# Patient Record
Sex: Male | Born: 1985 | Hispanic: Yes | Marital: Single | Smoking: Never smoker
Health system: Southern US, Community
[De-identification: ages and names within clinical notes are randomized; demographics above are authoritative.]

## PROBLEM LIST (undated history)

## (undated) DIAGNOSIS — J45909 Unspecified asthma, uncomplicated: Secondary | ICD-10-CM

---

## 2015-03-09 ENCOUNTER — Emergency Department (HOSPITAL_COMMUNITY): Payer: Self-pay

## 2015-03-09 ENCOUNTER — Emergency Department (HOSPITAL_COMMUNITY)
Admission: EM | Admit: 2015-03-09 | Discharge: 2015-03-10 | Disposition: A | Discharge status: Home / Self Care | Payer: Self-pay | Attending: Emergency Medicine | Admitting: Emergency Medicine

## 2015-03-09 ENCOUNTER — Encounter (HOSPITAL_COMMUNITY): Payer: Self-pay | Admitting: *Deleted

## 2015-03-09 DIAGNOSIS — H81399 Other peripheral vertigo, unspecified ear: Secondary | ICD-10-CM | POA: Insufficient documentation

## 2015-03-09 DIAGNOSIS — R0602 Shortness of breath: Secondary | ICD-10-CM | POA: Insufficient documentation

## 2015-03-09 DIAGNOSIS — R0789 Other chest pain: Secondary | ICD-10-CM | POA: Insufficient documentation

## 2015-03-09 DIAGNOSIS — H539 Unspecified visual disturbance: Secondary | ICD-10-CM | POA: Insufficient documentation

## 2015-03-09 DIAGNOSIS — H6123 Impacted cerumen, bilateral: Secondary | ICD-10-CM | POA: Insufficient documentation

## 2015-03-09 DIAGNOSIS — R509 Fever, unspecified: Secondary | ICD-10-CM | POA: Insufficient documentation

## 2015-03-09 LAB — BASIC METABOLIC PANEL
ANION GAP: 11 (ref 5–15)
BUN: 14 mg/dL (ref 6–20)
CHLORIDE: 107 mmol/L (ref 101–111)
CO2: 22 mmol/L (ref 22–32)
CREATININE: 0.93 mg/dL (ref 0.61–1.24)
Calcium: 9.3 mg/dL (ref 8.9–10.3)
GFR calc non Af Amer: >60 mL/min (ref >60)
GLUCOSE: 118 mg/dL — AB (ref 65–99)
Potassium: 3.8 mmol/L (ref 3.5–5.1)
Sodium: 140 mmol/L (ref 135–145)

## 2015-03-09 LAB — CBC
HCT: 45.6 % (ref 39.0–52.0)
HEMOGLOBIN: 15.9 g/dL (ref 13.0–17.0)
MCH: 30.3 pg (ref 26.0–34.0)
MCHC: 34.9 g/dL (ref 30.0–36.0)
MCV: 86.9 fL (ref 78.0–100.0)
Platelets: 257 10*3/uL (ref 150–400)
RBC: 5.25 MIL/uL (ref 4.22–5.81)
RDW: 13.3 % (ref 11.5–15.5)
WBC: 13.1 10*3/uL — ABNORMAL HIGH (ref 4.0–10.5)

## 2015-03-09 LAB — I-STAT TROPONIN, ED: Troponin i, poc: 0 ng/mL (ref 0.00–0.08)

## 2015-03-09 MED ORDER — MECLIZINE HCL 25 MG PO TABS
25.0000 mg | ORAL_TABLET | Freq: Once | ORAL | Status: AC
  Administered 2015-03-10: 25 mg via ORAL
  Filled 2015-03-09: qty 1

## 2015-03-09 NOTE — ED Notes (Signed)
Pt c/o left sided chest pain radiating to left arm since yesterday. Pt denies any shortness of breath, n/v. Pt denies any recent drug use

## 2015-03-09 NOTE — ED Notes (Signed)
Dr. Yelverton at the bedside.  

## 2015-03-09 NOTE — ED Provider Notes (Signed)
CSN: [id number]     Arrival date & time 03/09/15  2054 History   This chart was scribed for Loren Racer, MD by Evon Slack, ED Scribe. This patient was seen in room D30C/D30C and the patient's care was started at 11:06 PM.      Chief Complaint  Patient presents with  . Chest Pain   The history is provided by the patient. No language interpreter was used.   HPI Comments: Roberto Lowe is a 29 y.o. male who presents to the Emergency Department complaining of intermittent CP onset 2 days prior. Pt states that the pain is radiating to his left arm. Pt states that he is intermittently SOB and dizzy as well. Pt states that the pain is worse when taking a deep breath. He doesn't report any alleviating factors. Pt denies any recent long distance travel. Pt denies cough, fever, tinnitus, rhinorrhea, congestion, nausea or vomiting.   History reviewed. No pertinent past medical history. History reviewed. No pertinent past surgical history. History reviewed. No pertinent family history. Social History  Substance Use Topics  . Smoking status: Never Smoker   . Smokeless tobacco: Never Used  . Alcohol Use: No    Review of Systems  Constitutional: Positive for fever (subjective).  HENT: Negative for congestion, ear discharge, ear pain, hearing loss, rhinorrhea, sore throat and tinnitus.   Eyes: Positive for visual disturbance.  Respiratory: Positive for shortness of breath. Negative for cough, wheezing and stridor.   Cardiovascular: Positive for chest pain. Negative for palpitations and leg swelling.  Gastrointestinal: Negative for nausea.  Musculoskeletal: Negative for myalgias, back pain, neck pain and neck stiffness.  Skin: Negative for rash and wound.  Neurological: Positive for dizziness. Negative for syncope, weakness, light-headedness, numbness and headaches.  All other systems reviewed and are negative.    Allergies  Cortisone  Home Medications   Prior to Admission  medications   Medication Sig Start Date End Date Taking? Authorizing Provider  ibuprofen (ADVIL,MOTRIN) 600 MG tablet Take 1 tablet (600 mg total) by mouth every 6 (six) hours as needed. 03/10/15   Loren Racer, MD  meclizine (ANTIVERT) 25 MG tablet Take 1 tablet (25 mg total) by mouth 3 (three) times daily as needed for dizziness. 03/10/15   Loren Racer, MD   BP 126/52 mmHg  Pulse 59  Temp(Src) 98.5 F (36.9 C) (Oral)  Resp 23  Ht  (1.727 m)  Wt 220 lb (99.791 kg)  BMI 33.46 kg/m2  SpO2 99%   Physical Exam  Constitutional: He is oriented to person, place, and time. He appears well-developed and well-nourished. No distress.  HENT:  Head: Normocephalic and atraumatic.  Mouth/Throat: Oropharynx is clear and moist. No oropharyngeal exudate.  TMs occluded by cerumen impaction  Eyes: EOM are normal. Pupils are equal, round, and reactive to light.  Fatigable rotary nystagmus exacerbated when lifting head of bed  Neck: Normal range of motion. Neck supple.  No meningismus  Cardiovascular: Normal rate and regular rhythm.   Pulmonary/Chest: Effort normal and breath sounds normal. No respiratory distress. He has no wheezes. He has no rales. He exhibits no tenderness.  Exacerbation of chest tenderness with range of motion of the left shoulder  Abdominal: Soft. Bowel sounds are normal. He exhibits no distension and no mass. There is no tenderness. There is no rebound and no guarding.  Musculoskeletal: Normal range of motion. He exhibits no edema or tenderness.  No calf swelling or tenderness.  Neurological: He is alert and oriented to person,  place, and time.  Moves all extremities without deficit. Sensation is grossly intact. Bilateral finger to nose intact.  Skin: Skin is warm and dry. No rash noted. No erythema.  Psychiatric: He has a normal mood and affect. His behavior is normal.  Nursing note and vitals reviewed.   ED Course  Procedures (including critical care  time) DIAGNOSTIC STUDIES: Oxygen Saturation is 99% on RA, normal by my interpretation.    COORDINATION OF CARE: 11:22 PM-Discussed treatment plan with pt at bedside and pt agreed to plan.     Labs Review Labs Reviewed  BASIC METABOLIC PANEL - Abnormal; Notable for the following:    Glucose, Bld 118 (*)    All other components within normal limits  CBC - Abnormal; Notable for the following:    WBC 13.1 (*)    All other components within normal limits  D-DIMER, QUANTITATIVE (NOT AT [facility])  Rosezena Sensor, ED  Rosezena Sensor, ED    Imaging Review Dg Chest 2 View  03/09/2015   CLINICAL DATA:  Fever yesterday.  Dizziness today.  Left chest pain.  EXAM: CHEST  2 VIEW  COMPARISON:  None.  FINDINGS: The heart size and mediastinal contours are within normal limits. Both lungs are clear. The visualized skeletal structures are unremarkable.  IMPRESSION: No active cardiopulmonary disease.   Electronically Signed   By: Richarda Overlie M.D.   On: 03/09/2015 21:57   I have personally reviewed and evaluated these images and lab results as part of my medical decision-making.   EKG Interpretation   Date/Time:  Thursday March 09 2015 21:03:09 EDT Ventricular Rate:  85 PR Interval:  146 QRS Duration: 100 QT Interval:  342 QTC Calculation: 406 R Axis:   141 Text Interpretation:  Normal sinus rhythm Right axis deviation Cannot rule  out Inferior infarct , age undetermined Abnormal ECG Confirmed by  Ranae Palms  MD, Tensley Wery ([id number]) on 03/09/2015 11:06:07 PM      MDM   Final diagnoses:  Peripheral vertigo, unspecified laterality  Atypical chest pain     I personally performed the services described in this documentation, which was scribed in my presence. The recorded information has been reviewed and is accurate.  Patient with no evidence of ischemia on his EKG. No evidence of pericarditis. He has troponin 2 which are normal. After receiving meclizine and his dizziness has resolved. D-dimer  is also normal. Very low suspicion that this is cardiac in origin. Question pleurisy versus myositis. We'll treat with anti-inflammatories. Likely related to viral illness which may also be causing his peripheral vertigo. Do not suspect TIA/CVA as the cause for his dizziness. He's been given return precautions and has voiced understanding.     Loren Racer, MD 03/10/15 [phone]

## 2015-03-09 NOTE — ED Notes (Signed)
Phlebotomy at the bedside  

## 2015-03-09 NOTE — ED Notes (Signed)
Called main lab to have d-dimer added on.

## 2015-03-10 LAB — I-STAT TROPONIN, ED: TROPONIN I, POC: 0 ng/mL (ref 0.00–0.08)

## 2015-03-10 LAB — D-DIMER, QUANTITATIVE: D-Dimer, Quant: <0.27 ug/mL-FEU (ref 0.00–0.48)

## 2015-03-10 MED ORDER — MECLIZINE HCL 25 MG PO TABS: 25.0000 mg | ORAL_TABLET | Freq: Three times a day (TID) | ORAL | Status: DC | PRN

## 2015-03-10 MED ORDER — IBUPROFEN 600 MG PO TABS: 600.0000 mg | ORAL_TABLET | Freq: Four times a day (QID) | ORAL | Status: DC | PRN

## 2015-03-10 MED ORDER — IBUPROFEN 400 MG PO TABS
600.0000 mg | ORAL_TABLET | Freq: Once | ORAL | Status: AC
  Administered 2015-03-10: 600 mg via ORAL
  Filled 2015-03-10 (×2): qty 1

## 2015-03-10 NOTE — ED Notes (Signed)
Pt left at this time with all belongings.  

## 2015-03-10 NOTE — ED Notes (Signed)
Clarified with Dr. Ranae Palms, patient is to receive antivert for dizziness.

## 2015-03-10 NOTE — Discharge Instructions (Signed)
Vértigo postural benigno °(Benign Positional Vertigo) ° Vértigo es la sensación de que el entorno se mueve estando quieto. Es la forma más frecuente de vértigo. Benigno significa que la causa del trastorno no es grave. Es más frecuente en adultos mayores. °CAUSAS  °Es el resultado de un trastorno en el sistema laberíntico. Es una zona en el oído medio que ayuda a controlar el equilibrio. La causa puede ser una infección viral, una lesión en la cabeza o un movimiento repetitivo. Sin embargo, a menudo no se halla causa.  °SÍNTOMAS  °Los síntomas de vértigo posicional benigno se producen al mover la cabeza o los ojos en diferentes direcciones. Algunos de los síntomas pueden ser:  °· Pérdida de equilibrio y caídas. °· Vómitos. °· Visión borrosa. °· Mareos. °· Náuseas. °· Movimientos oculares involuntarios (nistagmus). °DIAGNÓSTICO  °El vértigo postural benigno se diagnostica mediante un examen físico. Si la causa específica de su vértigo posicional benigno es desconocido, su médico puede indicar diagnósticos por imágenes, como la resonancia magnética (RM) o la tomografía computada (TC).  °TRATAMIENTO  °El médico le podrá recomendar movimientos o procedimientos para corregir el vértigo posicional benigno. Para tratar los síntomas pueden indicarse medicamentos como meclizina, benzodiazepinas y medicamentos para las náuseas. En casos raros, si los síntomas son causados   por ciertos trastornos que afectan el oído interno, es posible que necesite cirugía.  °INSTRUCCIONES PARA EL CUIDADO EN EL HOGAR  °· Siga las indicaciones del médico. °· Muévase lentamente. No haga movimientos bruscos con la cabeza ni el cuerpo. °· Evite conducir vehículos. °· Evite operar maquinarias pesadas. °· Evite realizar tareas que serían peligrosas para usted u otras personas durante un episodio de vértigo. °· Debe ingerir gran cantidad de líquido para mantener la orina de tono claro o color amarillo pálido. °SOLICITE ATENCIÓN MÉDICA DE INMEDIATO  SI:  °· Tiene dificultad para hablar, caminar, siente debilidad o tiene problemas para usar los brazos, las manos o las piernas. °· Tiene dificultad para respirar. °· Sufre un dolor de cabeza intenso. °· Las náuseas o los vómitos persisten o empeoran. °· Tiene cambios en la visión. °· Sus familiares o amigos notan cambios en su conducta. °· El dolor empeora. °· Tiene fiebre. °· Comienza a sentir rigidez en el cuello o sensibilidad a la luz. °ASEGÚRESE DE QUE:  °· Comprende estas instrucciones. °· Controlará su enfermedad. °· Solicitará ayuda de inmediato si no mejora o si empeora. °Document Released: 10/24/2008 Document Revised: 09/30/2011 °ExitCare® Patient Information ©2015 [employer]. This information is not intended to replace advice given to you by your health care provider. Make sure you discuss any questions you have with your health care provider. ° °Dolor de pecho (no específico) °(Chest Pain (Nonspecific)) °Con frecuencia es difícil dar un diagnóstico específico de la causa del dolor de pecho. Siempre hay una posibilidad de que el dolor podría estar relacionado con algo grave, como un ataque al corazón o un coágulo sanguíneo en los pulmones. Debe someterse a controles con el médico para más evaluaciones. °CAUSAS  °· Acidez. °· Neumonía o bronquitis. °· Ansiedad o estrés. °· Inflamación de la zona que rodea al corazón (pericarditis) o a los pulmones (pleuritis o pleuresía). °· Un coágulo sanguíneo en el pulmón. °· Colapso de un pulmón (neumotórax), que puede aparecer de manera repentina por sí solo (neumotórax espontáneo) o debido a un traumatismo en el tórax. °· Culebrilla (virus del herpes zóster). °La pared torácica está compuesta por huesos, músculos y cartílago. Cualquiera de estos puede ser la fuente del   dolor. °· Puede haber una contusión en los huesos debido a una lesión. °· Puede haber un esguince en los músculos o el cartílago ocasionado por la tos o por trabajo excesivo. °· El cartílago puede  verse afectado por una inflamación y provocar dolor (costocondritis). °DIAGNÓSTICO  °Quizás se necesiten análisis de laboratorio u otros estudios para encontrar la causa del dolor. Además, puede indicarle que se haga una prueba llamada electrocadiograma (ECG) ambulatorio. El ECG registra los patrones de los latidos cardíacos durante 24 horas. Además, pueden hacerle otros estudios, por ejemplo: °· Ecocardiograma transtorácico (ETT). Durante el ecocardiograma, se usan ondas sonoras para evaluar el flujo de la sangre a través del corazón. °· Ecocardiograma transesofágico (ETE). °· Monitoreo cardíaco. Permite que el médico controle la frecuencia y el ritmo cardíaco en tiempo real. °· Monitor Holter. Es un dispositivo portátil que registra los latidos cardíacos y ayuda a diagnosticar las arritmias cardíacas. Le permite al médico registrar la actividad cardíaca durante varios días, si es necesario. °· Pruebas de estrés por ejercicio o por medicamentos que aceleran los latidos cardíacos. °TRATAMIENTO  °· El tratamiento depende de la causa del dolor de pecho. El tratamiento puede incluir: °¨ Inhibidores de la acidez estomacal. °¨ Antiinflamatorios. °¨ Analgésicos para las enfermedades inflamatorias. °¨ Antibióticos, si hay una infección. °· Podrán aconsejarle que modifique su estilo de vida. Esto incluye dejar de fumar y evitar el alcohol, la cafeína y el chocolate. °· Pueden aconsejarle que mantenga la cabeza levantada (elevada) cuando duerme. Esto reduce la probabilidad de que el ácido retroceda del estómago al esófago. °En la mayoría de los casos, el dolor de pecho no específico mejorará en el término de 2 a 3 días, con reposo y analgésicos suaves.  °INSTRUCCIONES PARA EL CUIDADO EN EL HOGAR  °· Si le prescriben antibióticos, tómelos tal como se le indicó. Termínelos aunque comience a sentirse mejor. °· Durante los días siguientes, no haga actividades físicas que provoquen dolor de pecho. Continúe con las actividades  físicas tal como se le indicó °· No consuma ningún producto que contenga tabaco, incluidos cigarrillos, tabaco de mascar o cigarrillos electrónicos. °· Evite el consumo de alcohol. °· Tome los medicamentos solamente como se lo haya indicado el médico. °· Siga las sugerencias del médico en lo que respecta a las pruebas adicionales, si el dolor de pecho no desaparece. °· Concurra a todas las visitas de control programadas. Si no lo hace, podría desarrollar problemas permanentes (crónicos) relacionados con el dolor. Si hay algún problema para concurrir a una cita, llame para reprogramarla. °SOLICITE ATENCIÓN MÉDICA SI:  °· El dolor de pecho no desaparece, incluso después del tratamiento. °· Tiene una erupción cutánea con ampollas en el pecho. °· Tiene fiebre. °SOLICITE ATENCIÓN MÉDICA DE INMEDIATO SI:  °· Aumenta el dolor de pecho o este se irradia hacia el brazo, el cuello, la mandíbula, la espalda o el abdomen. °· Le falta el aire. °· La tos empeora, o expectora sangre. °· Siente dolor intenso en la espalda o el abdomen. °· Se siente nauseoso o vomita. °· Siente debilidad intensa. °· Se desmaya. °· Tiene escalofríos. °Esto es una emergencia. No espere a ver si el dolor se pasa. Obtenga ayuda médica de inmediato. Llame a los servicios de emergencia locales (911 en los [place]). No conduzca por sus propios medios hasta el hospital. °ASEGÚRESE DE QUE:  °· Comprende estas instrucciones. °· Controlará su afección. °· Recibirá ayuda de inmediato si no mejora o si empeora. °Document Released: 07/08/2005 Document Revised: 07/13/2013 °ExitCare® Patient Information ©2015   [employer]. This information is not intended to replace advice given to you by your health care provider. Make sure you discuss any questions you have with your health care provider. ° °

## 2015-03-16 ENCOUNTER — Emergency Department (HOSPITAL_COMMUNITY)
Admission: EM | Admit: 2015-03-16 | Discharge: 2015-03-16 | Disposition: A | Discharge status: Home / Self Care | Payer: Self-pay | Attending: Emergency Medicine | Admitting: Emergency Medicine

## 2015-03-16 ENCOUNTER — Emergency Department (HOSPITAL_COMMUNITY): Payer: Self-pay

## 2015-03-16 ENCOUNTER — Encounter (HOSPITAL_COMMUNITY): Payer: Self-pay | Admitting: Emergency Medicine

## 2015-03-16 DIAGNOSIS — J45901 Unspecified asthma with (acute) exacerbation: Secondary | ICD-10-CM | POA: Insufficient documentation

## 2015-03-16 DIAGNOSIS — R519 Headache, unspecified: Secondary | ICD-10-CM

## 2015-03-16 DIAGNOSIS — R51 Headache: Secondary | ICD-10-CM | POA: Insufficient documentation

## 2015-03-16 DIAGNOSIS — J321 Chronic frontal sinusitis: Secondary | ICD-10-CM

## 2015-03-16 DIAGNOSIS — H6122 Impacted cerumen, left ear: Secondary | ICD-10-CM | POA: Insufficient documentation

## 2015-03-16 DIAGNOSIS — J45909 Unspecified asthma, uncomplicated: Secondary | ICD-10-CM

## 2015-03-16 HISTORY — DX: Unspecified asthma, uncomplicated: J45.909

## 2015-03-16 LAB — CBC WITH DIFFERENTIAL/PLATELET
Basophils Absolute: 0.1 10*3/uL (ref 0.0–0.1)
Basophils Relative: 0 % (ref 0–1)
EOS ABS: 0.6 10*3/uL (ref 0.0–0.7)
Eosinophils Relative: 5 % (ref 0–5)
HCT: 47.6 % (ref 39.0–52.0)
HEMOGLOBIN: 16.5 g/dL (ref 13.0–17.0)
LYMPHS ABS: 4.5 10*3/uL — AB (ref 0.7–4.0)
LYMPHS PCT: 36 % (ref 12–46)
MCH: 30.3 pg (ref 26.0–34.0)
MCHC: 34.7 g/dL (ref 30.0–36.0)
MCV: 87.3 fL (ref 78.0–100.0)
Monocytes Absolute: 0.8 10*3/uL (ref 0.1–1.0)
Monocytes Relative: 7 % (ref 3–12)
NEUTROS ABS: 6.7 10*3/uL (ref 1.7–7.7)
NEUTROS PCT: 52 % (ref 43–77)
Platelets: 245 10*3/uL (ref 150–400)
RBC: 5.45 MIL/uL (ref 4.22–5.81)
RDW: 13.3 % (ref 11.5–15.5)
WBC: 12.7 10*3/uL — AB (ref 4.0–10.5)

## 2015-03-16 LAB — COMPREHENSIVE METABOLIC PANEL
ALK PHOS: 78 U/L (ref 38–126)
ALT: 29 U/L (ref 17–63)
AST: 28 U/L (ref 15–41)
Albumin: 4.3 g/dL (ref 3.5–5.0)
Anion gap: 9 (ref 5–15)
BUN: 14 mg/dL (ref 6–20)
CALCIUM: 9.1 mg/dL (ref 8.9–10.3)
CO2: 24 mmol/L (ref 22–32)
CREATININE: 0.96 mg/dL (ref 0.61–1.24)
Chloride: 105 mmol/L (ref 101–111)
GFR calc non Af Amer: >60 mL/min (ref >60)
Glucose, Bld: 116 mg/dL — ABNORMAL HIGH (ref 65–99)
Potassium: 3.8 mmol/L (ref 3.5–5.1)
SODIUM: 138 mmol/L (ref 135–145)
Total Bilirubin: 0.8 mg/dL (ref 0.3–1.2)
Total Protein: 7.2 g/dL (ref 6.5–8.1)

## 2015-03-16 MED ORDER — ALBUTEROL SULFATE HFA 108 (90 BASE) MCG/ACT IN AERS: 1.0000 | INHALATION_SPRAY | Freq: Four times a day (QID) | RESPIRATORY_TRACT | Status: AC | PRN

## 2015-03-16 MED ORDER — METOCLOPRAMIDE HCL 5 MG PO TABS
5.0000 mg | ORAL_TABLET | Freq: Once | ORAL | Status: AC
  Administered 2015-03-16: 5 mg via ORAL
  Filled 2015-03-16: qty 1

## 2015-03-16 MED ORDER — KETOROLAC TROMETHAMINE 30 MG/ML IJ SOLN
30.0000 mg | Freq: Once | INTRAMUSCULAR | Status: AC
  Administered 2015-03-16: 30 mg via INTRAMUSCULAR
  Filled 2015-03-16: qty 1

## 2015-03-16 MED ORDER — DIPHENHYDRAMINE HCL 25 MG PO CAPS
25.0000 mg | ORAL_CAPSULE | Freq: Once | ORAL | Status: AC
  Administered 2015-03-16: 25 mg via ORAL
  Filled 2015-03-16: qty 1

## 2015-03-16 MED ORDER — LORATADINE 10 MG PO TABS: 10.0000 mg | ORAL_TABLET | Freq: Every day | ORAL | Status: DC

## 2015-03-16 NOTE — ED Provider Notes (Signed)
CSN: [id number]     Arrival date & time 03/16/15  1944 History   First MD Initiated Contact with Patient 03/16/15 2037     Chief Complaint  Patient presents with  . Shortness of Breath    HPI   29 year old male presents today with multiple complaints. Patient reports he woke up this morning and felt chest tightness, he reports this felt similar to previous asthma episodes. Patient notes that additionally had sinus congestion and a headache. He reports the headache is bilateral frontal, no radiation of symptoms, no changes in vision smell or taste, no focal neurological deficits. Patient denies fever, chills, nausea, vomiting. No headache red flags. Patient reports that he has not tried anything for the headache. He also notes sinus congestion, and watery eyes. He denies any purulent drainage from his nose. At the time of evaluation patient reports that he no longer had the chest tightness, notes that he was able to make it through work today with a headache, no symptoms of chest pain, shortness of breath, nausea, vomiting. Patient denies any recent surgery, prolonged immobilization, malignancy, lower extremity swelling or edema.  Past Medical History  Diagnosis Date  . Asthma    History reviewed. No pertinent past surgical history. No family history on file. Social History  Substance Use Topics  . Smoking status: Never Smoker   . Smokeless tobacco: Never Used  . Alcohol Use: No    Review of Systems  All other systems reviewed and are negative.   Allergies  Cortisone  Home Medications   Prior to Admission medications   Medication Sig Start Date End Date Taking? Authorizing Provider  albuterol (PROVENTIL HFA;VENTOLIN HFA) 108 (90 BASE) MCG/ACT inhaler Inhale 1-2 puffs into the lungs every 6 (six) hours as needed for wheezing or shortness of breath. 03/16/15   Eyvonne Mechanic, PA-C  loratadine (CLARITIN) 10 MG tablet Take 1 tablet (10 mg total) by mouth daily. 03/16/15   Eyvonne Mechanic,  PA-C   BP 113/51 mmHg  Pulse 55  Temp(Src) 97.9 F (36.6 C) (Oral)  Resp 22  Ht 5\' 7"  (1.702 m)  Wt 220 lb 1 oz (99.82 kg)  BMI 34.46 kg/m2  SpO2 97%    Physical Exam  Constitutional: He is oriented to person, place, and time. He appears well-developed and well-nourished.  HENT:  Head: Normocephalic and atraumatic.  Right Ear: Hearing, tympanic membrane, external ear and ear canal normal. No drainage or swelling. No mastoid tenderness. Tympanic membrane is not bulging. No middle ear effusion.  Left Ear: Hearing and tympanic membrane normal. No mastoid tenderness. Tympanic membrane is not bulging.  Nose: No rhinorrhea or sinus tenderness. Right sinus exhibits no maxillary sinus tenderness and no frontal sinus tenderness. Left sinus exhibits no maxillary sinus tenderness and no frontal sinus tenderness.  Cerumen impaction left  Eyes: Conjunctivae are normal. Pupils are equal, round, and reactive to light. Right eye exhibits no discharge. Left eye exhibits no discharge. No scleral icterus.  Neck: Normal range of motion. No JVD present. No tracheal deviation present.  Pulmonary/Chest: Effort normal and breath sounds normal. No accessory muscle usage or stridor. No respiratory distress. He has no decreased breath sounds. He has no wheezes. He has no rhonchi. He has no rales.  nontender to palpation  Musculoskeletal: Normal range of motion. He exhibits no edema or tenderness.  No lower extremity swelling or edema  Neurological: He is alert and oriented to person, place, and time. Coordination normal.  Skin: Skin is warm and dry.  No rash noted. No erythema. No pallor.  Psychiatric: He has a normal mood and affect. His behavior is normal. Judgment and thought content normal.  Nursing note and vitals reviewed.   ED Course  Procedures (including critical care time) Labs Review Labs Reviewed  CBC WITH DIFFERENTIAL/PLATELET - Abnormal; Notable for the following:    WBC 12.7 (*)    Lymphs  Abs 4.5 (*)    All other components within normal limits  COMPREHENSIVE METABOLIC PANEL - Abnormal; Notable for the following:    Glucose, Bld 116 (*)    All other components within normal limits    Imaging Review Dg Chest 2 View  03/16/2015   CLINICAL DATA:  Shortness of breath for 1 day  EXAM: CHEST  2 VIEW  COMPARISON:  March 09, 2015  FINDINGS: The heart size and mediastinal contours are within normal limits. There is no focal infiltrate, pulmonary edema, or pleural effusion. There is mild atelectasis of the lung bases. The visualized skeletal structures are unremarkable.  IMPRESSION: No active cardiopulmonary disease.   Electronically Signed   By: Sherian Rein M.D.   On: 03/16/2015 20:26   I have personally reviewed and evaluated these images and lab results as part of my medical decision-making.   EKG Interpretation None      MDM   Final diagnoses:  Asthma, unspecified asthma severity, uncomplicated  Nonintractable headache, unspecified chronicity pattern, unspecified headache type  Frontal sinusitis, unspecified chronicity     Labs: CBC, CMP- WBC 12.7  Imaging: DG chest 2 view- no significant findings  Consults:  Therapeutics: Toradol, Reglan, Benadryl  Discharge Meds: albuterol, Claritin  Assessment/Plan: patient's presentation most likely represents allergic rhinitis, headache, asthma. Patient had no chest pain, shortness of breath during my evaluation. His lungs were clear, no significant findings on physical exam. Vital signs reassuring. Patient will be discharged home with instructions to use Claritin, albuterol inhaler as needed for asthma related symptoms. Patient will be given [place] [facility] follow-up information, he is encouraged contact them first thing tomorrow morning and schedule follow-up visit this week. Patient given strict return precautions. Patient is perk negative, highly unlikely to be pulmonary embolism. Pt has no red flags for  headache, treated here in the ED with above medications with good symptom resolution.       Eyvonne Mechanic, PA-C 03/17/15 0030  Lorre Nick, MD 03/20/15 [phone]

## 2015-03-16 NOTE — ED Notes (Signed)
Pt. reports SOB onset this morning , denies chest pain , no cough or congestion / denies fever or chills.

## 2015-03-16 NOTE — Discharge Instructions (Signed)
Please monitor for new or worsening signs or symptoms, please follow-up with primary care provider for further evaluation and management.

## 2016-04-20 ENCOUNTER — Emergency Department (HOSPITAL_COMMUNITY)
Admission: EM | Admit: 2016-04-20 | Discharge: 2016-04-21 | Disposition: A | Discharge status: Home / Self Care | Payer: Self-pay | Attending: Emergency Medicine | Admitting: Emergency Medicine

## 2016-04-20 DIAGNOSIS — Z79899 Other long term (current) drug therapy: Secondary | ICD-10-CM | POA: Insufficient documentation

## 2016-04-20 DIAGNOSIS — R0602 Shortness of breath: Secondary | ICD-10-CM | POA: Insufficient documentation

## 2016-04-20 DIAGNOSIS — R0789 Other chest pain: Secondary | ICD-10-CM | POA: Insufficient documentation

## 2016-04-20 DIAGNOSIS — J45909 Unspecified asthma, uncomplicated: Secondary | ICD-10-CM | POA: Insufficient documentation

## 2016-04-20 NOTE — ED Triage Notes (Signed)
Pt. reports left chest pain radiating to left arm with SOB , palpitations and nasal congestion onset this week . Denies nausea or diaphoresis .

## 2016-04-21 ENCOUNTER — Emergency Department (HOSPITAL_COMMUNITY): Payer: Self-pay

## 2016-04-21 LAB — BASIC METABOLIC PANEL
ANION GAP: 9 (ref 5–15)
BUN: 13 mg/dL (ref 6–20)
CHLORIDE: 108 mmol/L (ref 101–111)
CO2: 23 mmol/L (ref 22–32)
CREATININE: 1.09 mg/dL (ref 0.61–1.24)
Calcium: 9.2 mg/dL (ref 8.9–10.3)
GFR calc non Af Amer: >60 mL/min (ref >60)
GLUCOSE: 103 mg/dL — AB (ref 65–99)
Potassium: 3.5 mmol/L (ref 3.5–5.1)
Sodium: 140 mmol/L (ref 135–145)

## 2016-04-21 LAB — D-DIMER, QUANTITATIVE (NOT AT ARMC)

## 2016-04-21 LAB — CBC
HCT: 46.8 % (ref 39.0–52.0)
HEMOGLOBIN: 15.7 g/dL (ref 13.0–17.0)
MCH: 29.7 pg (ref 26.0–34.0)
MCHC: 33.5 g/dL (ref 30.0–36.0)
MCV: 88.5 fL (ref 78.0–100.0)
Platelets: 282 10*3/uL (ref 150–400)
RBC: 5.29 MIL/uL (ref 4.22–5.81)
RDW: 13.1 % (ref 11.5–15.5)
WBC: 12.2 10*3/uL — ABNORMAL HIGH (ref 4.0–10.5)

## 2016-04-21 LAB — I-STAT TROPONIN, ED
Troponin i, poc: 0 ng/mL (ref 0.00–0.08)
Troponin i, poc: 0 ng/mL (ref 0.00–0.08)

## 2016-04-21 MED ORDER — ALBUTEROL SULFATE HFA 108 (90 BASE) MCG/ACT IN AERS
2.0000 | INHALATION_SPRAY | RESPIRATORY_TRACT | Status: DC | PRN
  Filled 2016-04-21: qty 6.7

## 2016-04-21 MED ORDER — NAPROXEN 500 MG PO TABS: 500.0000 mg | ORAL_TABLET | Freq: Two times a day (BID) | ORAL | 0 refills | Status: DC

## 2016-04-21 NOTE — ED Notes (Signed)
Patient transported to X-ray 

## 2016-04-21 NOTE — ED Provider Notes (Signed)
[facility] Provider Note   CSN: [id number] Arrival date & time: 04/20/16  2333  History   Chief Complaint Chief Complaint  Patient presents with  . Chest Pain    HPI Roberto Lowe is a 30 y.o. male.  HPI  Patient is a 30 year old male who presents to the emergency department with complaints of recurrent left sided chest pain. Denies pain currently. Past medical history significant for asthma. States he first had this pain on 4 nights ago when he was lying in his bed. Describes it as a pressure in his chest, from center of chest to left side and moves down his left arm and when he has the pain he feels short of breath. He feels like he wakes up with the pain but doesn't know when it goes away. He does not have the pain when he is at work and it recurs nightly when he lies down in bed. He feels like his heart is racing. He denies blurred vision, dizziness, syncope, leg swelling, numbness of tingling in his fingers. Was started on antibiotics 3 days ago for a sinus congestion, does not regularly take meds. No family history of cardiac disease or MI.   Past Medical History:  Diagnosis Date  . Asthma     There are no active problems to display for this patient.   No past surgical history on file.     Home Medications    Prior to Admission medications   Medication Sig Start Date End Date Taking? Authorizing Provider  albuterol (PROVENTIL HFA;VENTOLIN HFA) 108 (90 BASE) MCG/ACT inhaler Inhale 1-2 puffs into the lungs every 6 (six) hours as needed for wheezing or shortness of breath. 03/16/15  Yes Eyvonne Mechanic, PA-C  cetirizine (ZYRTEC) 10 MG tablet Take 10 mg by mouth daily as needed for allergies.   Yes Historical Provider, MD  clarithromycin (BIAXIN) 500 MG tablet Take 500 mg by mouth 2 (two) times daily.   Yes Historical Provider, MD  naproxen (NAPROSYN) 500 MG tablet Take 1 tablet (500 mg total) by mouth 2 (two) times daily. 04/21/16   Marlon Pel, PA-C     Family History No family history on file.  Social History Social History  Substance Use Topics  . Smoking status: Never Smoker  . Smokeless tobacco: Never Used  . Alcohol use No     Allergies   Cortisone   Review of Systems Review of Systems Review of Systems All other systems negative except as documented in the HPI. All pertinent positives and negatives as reviewed in the HPI.   Physical Exam Updated Vital Signs BP 125/69   Pulse 66   Temp 98.1 F (36.7 C) (Oral)   Resp 20   Ht 5\' 9"  (1.753 m)   Wt 97.1 kg   SpO2 99%   BMI 31.60 kg/m   Physical Exam  Constitutional: He is oriented to person, place, and time. He appears well-developed and well-nourished. No distress.  HENT:  Head: Normocephalic and atraumatic.  Eyes: EOM are normal. Pupils are equal, round, and reactive to light.  Neck: Normal range of motion. Neck supple.  Cardiovascular: Normal rate and regular rhythm.   Pulmonary/Chest: Effort normal and breath sounds normal. He exhibits tenderness.  Abdominal: Soft.  Musculoskeletal: Normal range of motion.  Neurological: He is alert and oriented to person, place, and time.  Skin: Skin is warm and dry.  Nursing note and vitals reviewed.   ED Treatments / Results  Labs (all labs ordered are  listed, but only abnormal results are displayed) Labs Reviewed  BASIC METABOLIC PANEL - Abnormal; Notable for the following:       Result Value   Glucose, Bld 103 (*)    All other components within normal limits  CBC - Abnormal; Notable for the following:    WBC 12.2 (*)    All other components within normal limits  D-DIMER, QUANTITATIVE (NOT AT [facility])  Rosezena SensorI-STAT TROPOININ, ED  I-STAT TROPOININ, ED    EKG  EKG Interpretation  Date/Time:  Saturday April 20 2016 23:41:07 EDT Ventricular Rate:  97 PR Interval:  148 QRS Duration: 104 QT Interval:  342 QTC Calculation: 434 R Axis:   143 Text Interpretation:  Normal sinus rhythm Right axis deviation  Abnormal ECG No significant change vs 03-16-15 Confirmed by Fayrene FearingJAMES  MD, MARK ([id number]) on 04/20/2016 11:52:39 PM       Radiology Dg Chest 2 View  Result Date: 04/21/2016 CLINICAL DATA:  Pt. reports left chest pain radiating to left arm with shortness of breath, palpitations and nasal congestion onset this week . Denies nausea or diaphoresis EXAM: CHEST  2 VIEW COMPARISON:  03/16/2015 FINDINGS: Shallow inspiration. The heart size and mediastinal contours are within normal limits. Both lungs are clear. The visualized skeletal structures are unremarkable. IMPRESSION: No active cardiopulmonary disease. Electronically Signed   By: Burman NievesWilliam  Stevens M.D.   On: 04/21/2016 01:07    Procedures Procedures (including critical care time)  Medications Ordered in ED Medications  albuterol (PROVENTIL HFA;VENTOLIN HFA) 108 (90 Base) MCG/ACT inhaler 2 puff (not administered)     Initial Impression / Assessment and Plan / ED Course  I have reviewed the triage vital signs and the nursing notes.  Pertinent labs & imaging results that were available during my care of the patient were reviewed by me and considered in my medical decision making (see chart for details).  Clinical Course    Patient has a normal chest xray, two negative troponins, a negative d-dimer and is well appearing. Unclear etiology. Will start on NSAIDs and give albuterol inhaler for SOB and [facility].  Final Clinical Impressions(s) / ED Diagnoses   Final diagnoses:  Chest wall pain  SOB (shortness of breath)    New Prescriptions New Prescriptions   NAPROXEN (NAPROSYN) 500 MG TABLET    Take 1 tablet (500 mg total) by mouth 2 (two) times daily.     Marlon Peliffany Sila Sarsfield, PA-C 04/21/16 [id number]    Rolland PorterMark James, MD 04/25/16 [phone]

## 2016-04-23 ENCOUNTER — Ambulatory Visit (HOSPITAL_COMMUNITY)
Admission: EM | Admit: 2016-04-23 | Discharge: 2016-04-23 | Disposition: A | Discharge status: Home / Self Care | Payer: Self-pay | Attending: Family Medicine | Admitting: Family Medicine

## 2016-04-23 ENCOUNTER — Encounter (HOSPITAL_COMMUNITY): Payer: Self-pay | Admitting: Emergency Medicine

## 2016-04-23 DIAGNOSIS — J3089 Other allergic rhinitis: Secondary | ICD-10-CM

## 2016-04-23 DIAGNOSIS — T887XXA Unspecified adverse effect of drug or medicament, initial encounter: Secondary | ICD-10-CM

## 2016-04-23 MED ORDER — IPRATROPIUM BROMIDE 0.06 % NA SOLN: 2.0000 | Freq: Four times a day (QID) | NASAL | 1 refills | Status: AC

## 2016-04-23 NOTE — ED Triage Notes (Signed)
Pt. Stated, I've had this abdominal pain since yesterday and my breathing is not well.  He is taking Biaxin for sinus problem.

## 2016-04-23 NOTE — ED Provider Notes (Signed)
[facility]    CSN: [id number] Arrival date & time: 04/23/16  1945     History   Chief Complaint Chief Complaint  Patient presents with  . Abdominal Pain  . Shortness of Breath    HPI Roberto Lowe is a 30 y.o. male.   The history is provided by the patient.  Abdominal Pain  Pain location:  LLQ Pain quality: cramping   Pain radiates to:  Does not radiate Progression:  Unchanged Chronicity:  New Context: recent illness   Context comment:  Given biaxin 1 week ago for sinus sx by lmd, , yest develloped diarrhea and cramping abd pain., no blood. Associated symptoms: diarrhea and shortness of breath   Associated symptoms: no chills, no dysuria, no fever, no nausea and no vomiting   Shortness of Breath  Associated symptoms: abdominal pain   Associated symptoms: no fever and no vomiting     Past Medical History:  Diagnosis Date  . Asthma     There are no active problems to display for this patient.   History reviewed. No pertinent surgical history.     Home Medications    Prior to Admission medications   Medication Sig Start Date End Date Taking? Authorizing Provider  albuterol (PROVENTIL HFA;VENTOLIN HFA) 108 (90 BASE) MCG/ACT inhaler Inhale 1-2 puffs into the lungs every 6 (six) hours as needed for wheezing or shortness of breath. 03/16/15   Eyvonne MechanicJeffrey Hedges, PA-C  cetirizine (ZYRTEC) 10 MG tablet Take 10 mg by mouth daily as needed for allergies.    Historical Provider, MD  clarithromycin (BIAXIN) 500 MG tablet Take 500 mg by mouth 2 (two) times daily.    Historical Provider, MD  naproxen (NAPROSYN) 500 MG tablet Take 1 tablet (500 mg total) by mouth 2 (two) times daily. 04/21/16   Marlon Peliffany Greene, PA-C    Family History No family history on file.  Social History Social History  Substance Use Topics  . Smoking status: Never Smoker  . Smokeless tobacco: Never Used  . Alcohol use No     Allergies   Cortisone   Review of Systems Review of  Systems  Constitutional: Negative.  Negative for chills and fever.  Respiratory: Positive for shortness of breath.   Gastrointestinal: Positive for abdominal pain and diarrhea. Negative for nausea and vomiting.  Genitourinary: Negative.  Negative for dysuria.  All other systems reviewed and are negative.    Physical Exam Triage Vital Signs ED Triage Vitals  Enc Vitals Group     BP 04/23/16 2055 149/75     Pulse Rate 04/23/16 2055 89     Resp 04/23/16 2055 17     Temp 04/23/16 2055 98.1 F (36.7 C)     Temp Source 04/23/16 2055 Oral     SpO2 04/23/16 2055 98 %     Weight --      Height --      Head Circumference --      Peak Flow --      Pain Score 04/23/16 2057 9     Pain Loc --      Pain Edu? --      Excl. in GC? --    No data found.   Updated Vital Signs BP 149/75 (BP Location: Left Arm)   Pulse 89   Temp 98.1 F (36.7 C) (Oral)   Resp 17   SpO2 98%   Visual Acuity Right Eye Distance:   Left Eye Distance:   Bilateral Distance:  Right Eye Near:   Left Eye Near:    Bilateral Near:     Physical Exam  Constitutional: He is oriented to person, place, and time. He appears well-developed and well-nourished.  HENT:  Mouth/Throat: Oropharynx is clear and moist.  Neck: Normal range of motion.  Cardiovascular: Normal rate, regular rhythm, normal heart sounds and intact distal pulses.   Pulmonary/Chest: Effort normal and breath sounds normal.  Abdominal: Soft. Bowel sounds are normal. He exhibits no distension and no mass. There is tenderness. There is no rebound and no guarding. No hernia.  Lymphadenopathy:    He has no cervical adenopathy.  Neurological: He is alert and oriented to person, place, and time.  Skin: Skin is warm and dry.  Nursing note and vitals reviewed.    UC Treatments / Results  Labs (all labs ordered are listed, but only abnormal results are displayed) Labs Reviewed - No data to display  EKG  EKG Interpretation None        Radiology No results found.  Procedures Procedures (including critical care time)  Medications Ordered in UC Medications - No data to display   Initial Impression / Assessment and Plan / UC Course  I have reviewed the triage vital signs and the nursing notes.  Pertinent labs & imaging results that were available during my care of the patient were reviewed by me and considered in my medical decision making (see chart for details).  Clinical Course      Final Clinical Impressions(s) / UC Diagnoses   Final diagnoses:  None    New Prescriptions New Prescriptions   No medications on file     Linna Hoff, MD 04/23/16 2120

## 2016-04-23 NOTE — Discharge Instructions (Signed)
Use probiotic and lots of liquids and stop biaxin. See your doctor if further problems.

## 2016-05-03 ENCOUNTER — Telehealth: Payer: Self-pay | Admitting: General Practice

## 2016-05-03 NOTE — Telephone Encounter (Signed)
Caller name:Joseh Mordecai MaesSanchez Relationship to patient: Can be reached:[phone] Pharmacy:  Reason for call:Patient is requesting to establish care with Dr Drue NovelPaz, self pay. Please advise.

## 2016-05-03 NOTE — Telephone Encounter (Signed)
Unfortunately I'm not accepting new patients but other providers are. Thank you 

## 2016-05-03 NOTE — Telephone Encounter (Signed)
Patient aware.

## 2016-06-11 ENCOUNTER — Emergency Department (HOSPITAL_COMMUNITY): Payer: Self-pay

## 2016-06-11 ENCOUNTER — Emergency Department (HOSPITAL_COMMUNITY)
Admission: EM | Admit: 2016-06-11 | Discharge: 2016-06-11 | Disposition: A | Discharge status: Home / Self Care | Payer: Self-pay | Attending: Emergency Medicine | Admitting: Emergency Medicine

## 2016-06-11 ENCOUNTER — Encounter (HOSPITAL_COMMUNITY): Payer: Self-pay | Admitting: Emergency Medicine

## 2016-06-11 DIAGNOSIS — R51 Headache: Secondary | ICD-10-CM

## 2016-06-11 DIAGNOSIS — R200 Anesthesia of skin: Secondary | ICD-10-CM | POA: Insufficient documentation

## 2016-06-11 DIAGNOSIS — J45909 Unspecified asthma, uncomplicated: Secondary | ICD-10-CM | POA: Insufficient documentation

## 2016-06-11 DIAGNOSIS — J069 Acute upper respiratory infection, unspecified: Secondary | ICD-10-CM | POA: Insufficient documentation

## 2016-06-11 DIAGNOSIS — R519 Headache, unspecified: Secondary | ICD-10-CM

## 2016-06-11 MED ORDER — PROCHLORPERAZINE EDISYLATE 5 MG/ML IJ SOLN
10.0000 mg | Freq: Once | INTRAMUSCULAR | Status: AC
  Administered 2016-06-11: 10 mg via INTRAVENOUS
  Filled 2016-06-11: qty 2

## 2016-06-11 MED ORDER — DIPHENHYDRAMINE HCL 50 MG/ML IJ SOLN
25.0000 mg | Freq: Once | INTRAMUSCULAR | Status: AC
  Administered 2016-06-11: 25 mg via INTRAVENOUS
  Filled 2016-06-11: qty 1

## 2016-06-11 MED ORDER — ACETAMINOPHEN 500 MG PO TABS
1000.0000 mg | ORAL_TABLET | Freq: Once | ORAL | Status: AC
Start: 2016-06-11 — End: 2016-06-11
  Administered 2016-06-11: 1000 mg via ORAL
  Filled 2016-06-11: qty 2

## 2016-06-11 NOTE — ED Provider Notes (Signed)
[facility]   CSN: [id number] Arrival date & time: 06/11/16  1133     History   Chief Complaint Chief Complaint  Patient presents with  . Numbness  . Headache    HPI Roberto Lowe is a 30 y.o. male.  30 yo M with a chief complaint of a headache and numbness to the left side of his head. This been going on for the past 4 hours. Patient also has been having cough congestion for the past week or so. Denies significant sinus tenderness. Denies ear pain. Denies fevers or chills. Has had a mild cough. Denies sick contacts. Denies unilateral weakness. Denies difficulty with speech or coordination.   The history is provided by the patient.  Headache   This is a new problem. The current episode started 1 to 2 hours ago. The problem occurs constantly. The problem has not changed since onset.The headache is associated with nothing. The pain is located in the bilateral region. The pain is at a severity of 8/10. The pain is moderate. The pain does not radiate. Pertinent negatives include no fever, no palpitations, no shortness of breath and no vomiting. He has tried nothing for the symptoms. The treatment provided no relief.    Past Medical History:  Diagnosis Date  . Asthma     There are no active problems to display for this patient.   History reviewed. No pertinent surgical history.     Home Medications    Prior to Admission medications   Medication Sig Start Date End Date Taking? Authorizing Provider  albuterol (PROVENTIL HFA;VENTOLIN HFA) 108 (90 BASE) MCG/ACT inhaler Inhale 1-2 puffs into the lungs every 6 (six) hours as needed for wheezing or shortness of breath. 03/16/15   Eyvonne MechanicJeffrey Hedges, PA-C  cetirizine (ZYRTEC) 10 MG tablet Take 10 mg by mouth daily as needed for allergies.    Historical Provider, MD  clarithromycin (BIAXIN) 500 MG tablet Take 500 mg by mouth 2 (two) times daily.    Historical Provider, MD  ipratropium (ATROVENT) 0.06 % nasal  spray Place 2 sprays into both nostrils 4 (four) times daily. 04/23/16   Linna HoffJames D Kindl, MD  naproxen (NAPROSYN) 500 MG tablet Take 1 tablet (500 mg total) by mouth 2 (two) times daily. 04/21/16   Marlon Peliffany Greene, PA-C    Family History History reviewed. No pertinent family history.  Social History Social History  Substance Use Topics  . Smoking status: Never Smoker  . Smokeless tobacco: Never Used  . Alcohol use No     Allergies   Cortisone   Review of Systems Review of Systems  Constitutional: Negative for chills and fever.  HENT: Negative for congestion and facial swelling.   Eyes: Negative for discharge and visual disturbance.  Respiratory: Negative for shortness of breath.   Cardiovascular: Negative for chest pain and palpitations.  Gastrointestinal: Negative for abdominal pain, diarrhea and vomiting.  Musculoskeletal: Negative for arthralgias and myalgias.  Skin: Negative for color change and rash.  Neurological: Positive for numbness and headaches. Negative for tremors and syncope.  Psychiatric/Behavioral: Negative for confusion and dysphoric mood.     Physical Exam Updated Vital Signs BP 117/99 (BP Location: Left Arm)   Pulse 68   Temp 98.8 F (37.1 C) (Oral)   Resp 18   Ht 5\' 9"  (1.753 m)   Wt 210 lb (95.3 kg)   SpO2 99%   BMI 31.01 kg/m   Physical Exam  Constitutional: He is oriented to person, place, and time.  He appears well-developed and well-nourished.  HENT:  Head: Normocephalic and atraumatic.    Swollen turbinates, posterior nasal drip, no noted sinus ttp, tm normal bilaterally.    Eyes: EOM are normal. Pupils are equal, round, and reactive to light.  Neck: Normal range of motion. Neck supple. No JVD present.  Cardiovascular: Normal rate and regular rhythm.  Exam reveals no gallop and no friction rub.   No murmur heard. Pulmonary/Chest: No respiratory distress. He has no wheezes.  Abdominal: He exhibits no distension. There is no rebound and  no guarding.  Musculoskeletal: Normal range of motion.  Neurological: He is alert and oriented to person, place, and time. He has normal strength. No sensory deficit. He displays a negative Romberg sign. Coordination and gait normal. GCS eye subscore is 4. GCS verbal subscore is 5. GCS motor subscore is 6. He displays no Babinski's sign on the right side. He displays no Babinski's sign on the left side.  Reflex Scores:      Tricep reflexes are 2+ on the right side and 2+ on the left side.      Bicep reflexes are 2+ on the right side and 2+ on the left side.      Brachioradialis reflexes are 2+ on the right side and 2+ on the left side.      Patellar reflexes are 2+ on the right side and 2+ on the left side.      Achilles reflexes are 2+ on the right side and 2+ on the left side. The patient has subjective numbness to the left parietal area. His left frontal area has intact sensation to light touch is equal to the other side.  Skin: No rash noted. No pallor.  Psychiatric: He has a normal mood and affect. His behavior is normal.  Nursing note and vitals reviewed.    ED Treatments / Results  Labs (all labs ordered are listed, but only abnormal results are displayed) Labs Reviewed - No data to display  EKG  EKG Interpretation None       Radiology Ct Head Wo Contrast  Result Date: 06/11/2016 CLINICAL DATA:  Generalized headache. Numbness in the left frontal and occipital areas of the head. EXAM: CT HEAD WITHOUT CONTRAST TECHNIQUE: Contiguous axial images were obtained from the base of the skull through the vertex without intravenous contrast. COMPARISON:  None. FINDINGS: Brain: No evidence for acute hemorrhage, mass lesion, midline shift, hydrocephalus or large infarct. Vascular: Slightly increased density in the basilar artery is thought to be within normal limits and not indicative for acute thrombus. Skull: No acute bone abnormality. Sinuses/Orbits: Minimal disease in left maxillary  sinus. Minimal disease in the left sphenoid sinus. Other: None IMPRESSION: No acute intracranial abnormality. Electronically Signed   By: Richarda OverlieAdam  Henn M.D.   On: 06/11/2016 13:00    Procedures Procedures (including critical care time)  Medications Ordered in ED Medications  prochlorperazine (COMPAZINE) injection 10 mg (10 mg Intravenous Given 06/11/16 1304)  diphenhydrAMINE (BENADRYL) injection 25 mg (25 mg Intravenous Given 06/11/16 1304)  acetaminophen (TYLENOL) tablet 1,000 mg (1,000 mg Oral Given 06/11/16 1304)     Initial Impression / Assessment and Plan / ED Course  I have reviewed the triage vital signs and the nursing notes.  Pertinent labs & imaging results that were available during my care of the patient were reviewed by me and considered in my medical decision making (see chart for details).  Clinical Course     30 yo M With a chief  complaint of a headache. Patient does not typically get headaches. He also has a area of subjective numbness. He has no other noted neurologic deficits. Will obtain a CT of the head as the patient does not typically have headaches. Treat with a headache cocktail and reassess.  Patient symptoms have completely resolved. CT of the head was negative. Discharge home.  :  I have discussed the diagnosis/risks/treatment options with the patient and family and believe the pt to be eligible for discharge home to follow-up with PCP. We also discussed returning to the ED immediately if new or worsening sx occur. We discussed the sx which are most concerning (e.g., sudden worsening pain, worsening numbness or weakness) that necessitate immediate return. Medications administered to the patient during their visit and any new prescriptions provided to the patient are listed below.  Medications given during this visit Medications  prochlorperazine (COMPAZINE) injection 10 mg (10 mg Intravenous Given 06/11/16 1304)  diphenhydrAMINE (BENADRYL) injection 25 mg (25  mg Intravenous Given 06/11/16 1304)  acetaminophen (TYLENOL) tablet 1,000 mg (1,000 mg Oral Given 06/11/16 1304)     The patient appears reasonably screen and/or stabilized for discharge and I doubt any other medical condition or other [facility] requiring further screening, evaluation, or treatment in the ED at this time prior to discharge.    Final Clinical Impressions(s) / ED Diagnoses   Final diagnoses:  Acute nonintractable headache, unspecified headache type  Upper respiratory tract infection, unspecified type    New Prescriptions Discharge Medication List as of 06/11/2016  1:45 PM       Melene Plan, DO 06/11/16 1609

## 2016-06-11 NOTE — ED Triage Notes (Signed)
Pt from home with c/o generalized headache upon waking this morning between 8/830 am.  Pt reports numbness on his left frontal and occipital areas of his head.  LSW 2300 last night.  Pt in NAD, A&O, ambulatory.

## 2016-06-11 NOTE — ED Notes (Signed)
NAD at this time. Pt is stable and going home.  

## 2016-06-11 NOTE — Discharge Instructions (Signed)
Take tylenol 2 pills 4 times a day and motrin 4 pills 3 times a day.  Drink plenty of fluids.  Return for worsening shortness of breath, headache, confusion. Follow up with your family doctor.   

## 2016-08-09 IMAGING — DX DG CHEST 2V
2 series · 2 of 2 positions shown · non-contrast
Comparison: March 09, 2015

CLINICAL DATA: Shortness of breath for 1 day

EXAM:
CHEST  2 VIEW

[chest pa]
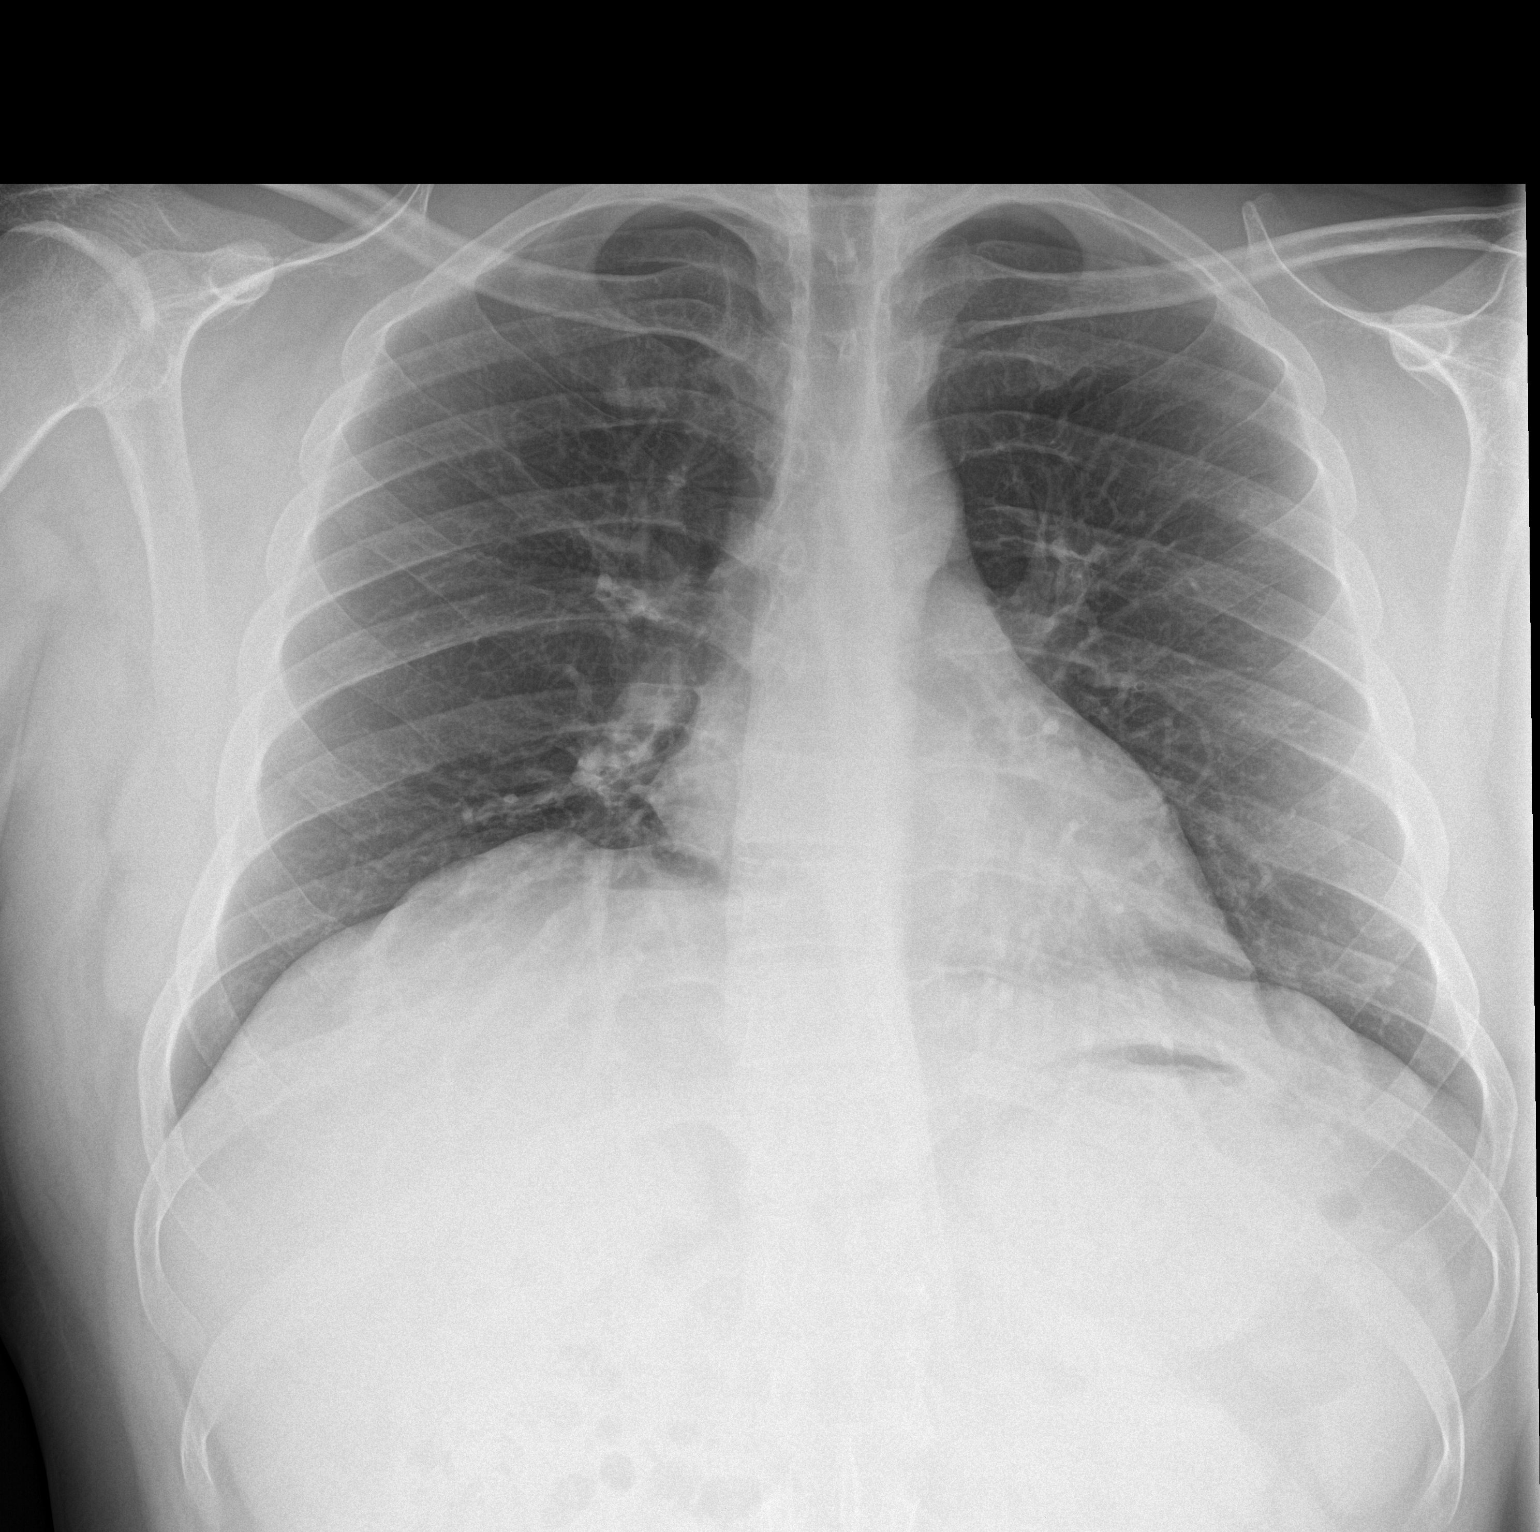

[chest lat]
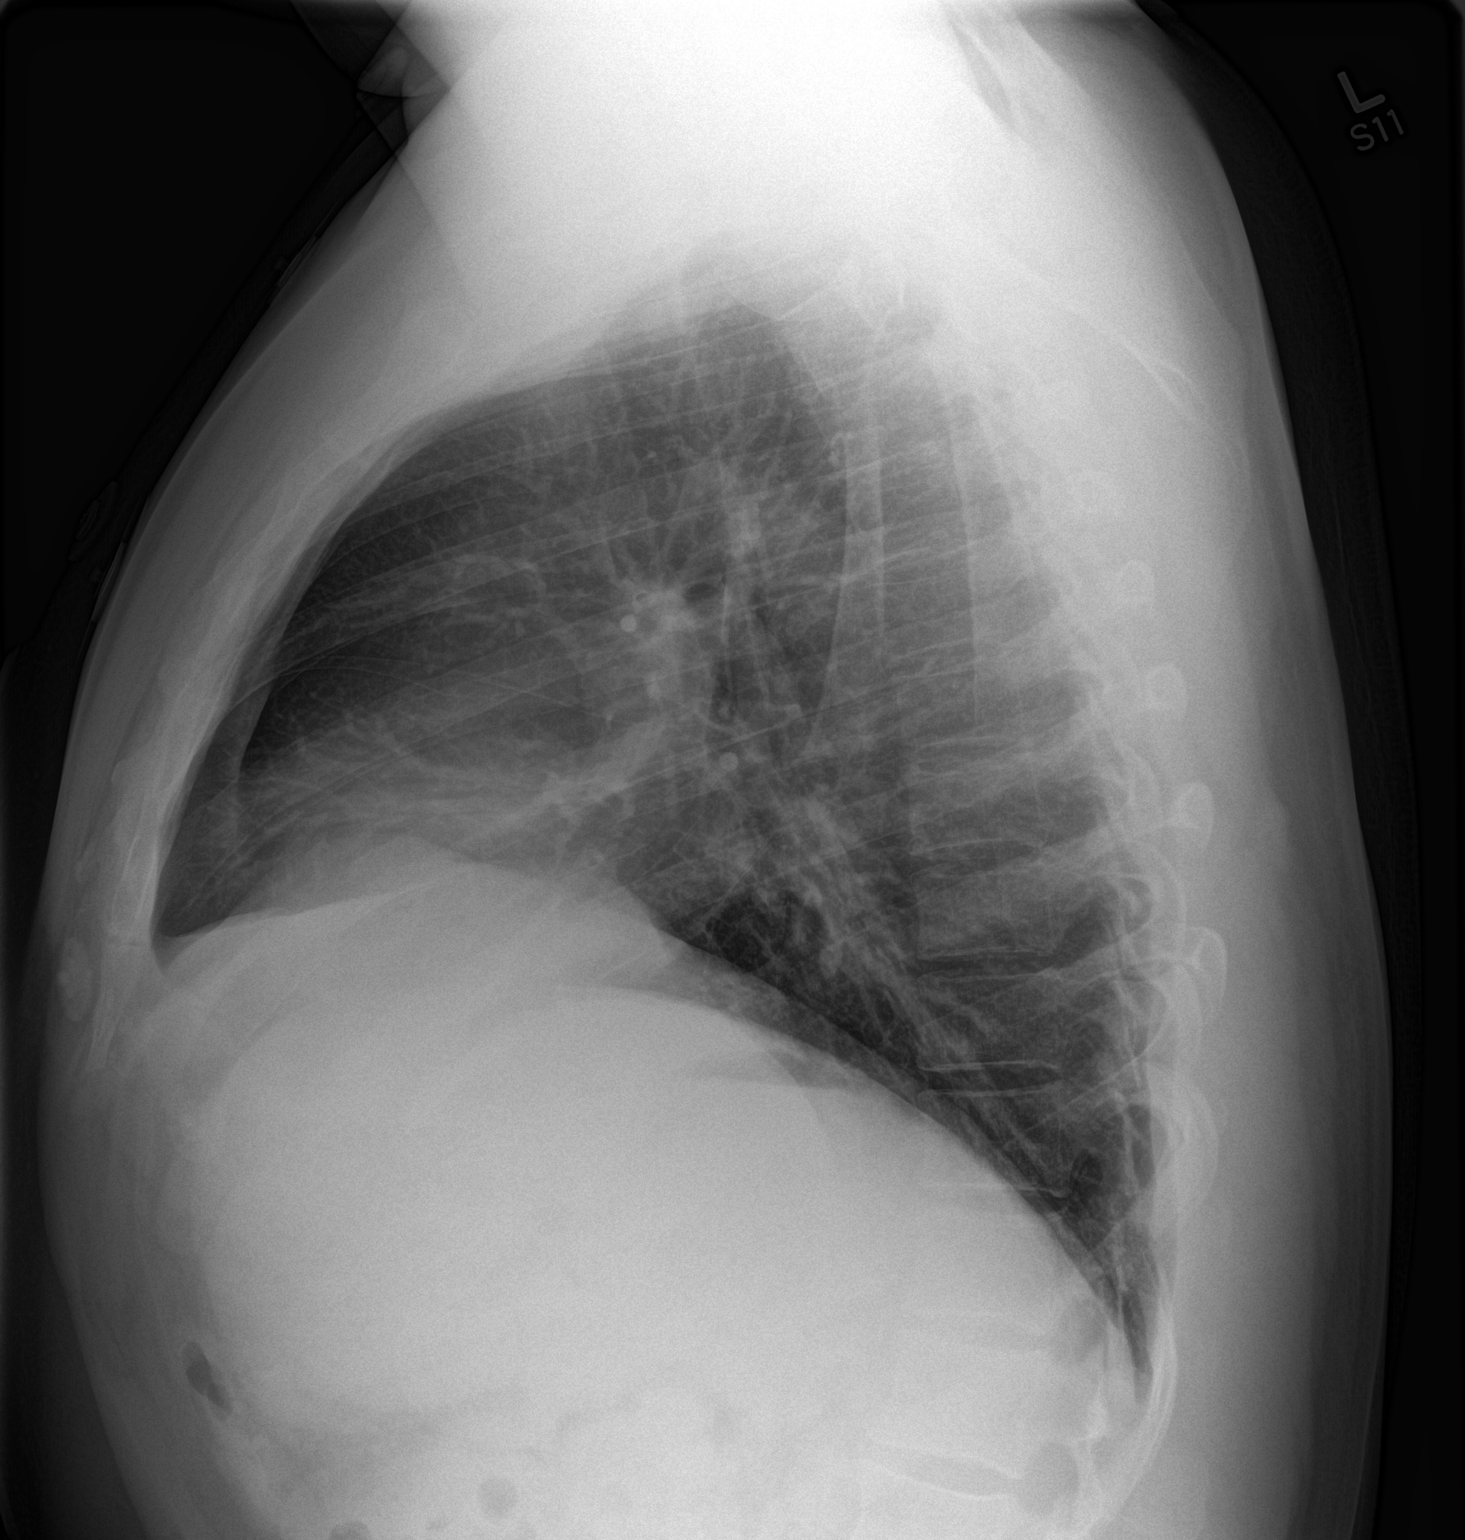

[2 of 2 positions shown; findings below may reference images not displayed]

FINDINGS: The heart size and mediastinal contours are within normal limits.
There is no focal infiltrate, pulmonary edema, or pleural effusion.
There is mild atelectasis of the lung bases. The visualized skeletal
structures are unremarkable.
IMPRESSION: No active cardiopulmonary disease.

## 2016-08-17 ENCOUNTER — Emergency Department (HOSPITAL_COMMUNITY): Payer: No Typology Code available for payment source

## 2016-08-17 ENCOUNTER — Emergency Department (HOSPITAL_COMMUNITY)
Admission: EM | Admit: 2016-08-17 | Discharge: 2016-08-17 | Disposition: A | Discharge status: Home / Self Care | Payer: No Typology Code available for payment source | Attending: Emergency Medicine | Admitting: Emergency Medicine

## 2016-08-17 ENCOUNTER — Encounter (HOSPITAL_COMMUNITY): Payer: Self-pay | Admitting: Emergency Medicine

## 2016-08-17 DIAGNOSIS — J45909 Unspecified asthma, uncomplicated: Secondary | ICD-10-CM | POA: Diagnosis not present

## 2016-08-17 DIAGNOSIS — Y999 Unspecified external cause status: Secondary | ICD-10-CM | POA: Insufficient documentation

## 2016-08-17 DIAGNOSIS — Y92410 Unspecified street and highway as the place of occurrence of the external cause: Secondary | ICD-10-CM | POA: Diagnosis not present

## 2016-08-17 DIAGNOSIS — S9032XA Contusion of left foot, initial encounter: Secondary | ICD-10-CM | POA: Diagnosis not present

## 2016-08-17 DIAGNOSIS — Y9301 Activity, walking, marching and hiking: Secondary | ICD-10-CM | POA: Diagnosis not present

## 2016-08-17 DIAGNOSIS — S99922A Unspecified injury of left foot, initial encounter: Secondary | ICD-10-CM | POA: Diagnosis present

## 2016-08-17 MED ORDER — DICLOFENAC SODIUM 50 MG PO TBEC: 50.0000 mg | DELAYED_RELEASE_TABLET | Freq: Two times a day (BID) | ORAL | 0 refills | Status: AC

## 2016-08-17 MED ORDER — IBUPROFEN 400 MG PO TABS
600.0000 mg | ORAL_TABLET | Freq: Once | ORAL | Status: AC
  Administered 2016-08-17: 600 mg via ORAL
  Filled 2016-08-17: qty 1

## 2016-08-17 NOTE — ED Notes (Signed)
Pt taken to xray via wheelchair
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
## 2017-09-15 IMAGING — DX DG CHEST 2V
2 series · 2 of 2 positions shown · non-contrast
Comparison: 03/16/2015

CLINICAL DATA: Pt. reports left chest pain radiating to left arm
with shortness of breath, palpitations and nasal congestion onset
this week . Denies nausea or diaphoresis

EXAM:
CHEST  2 VIEW

[chest pa]
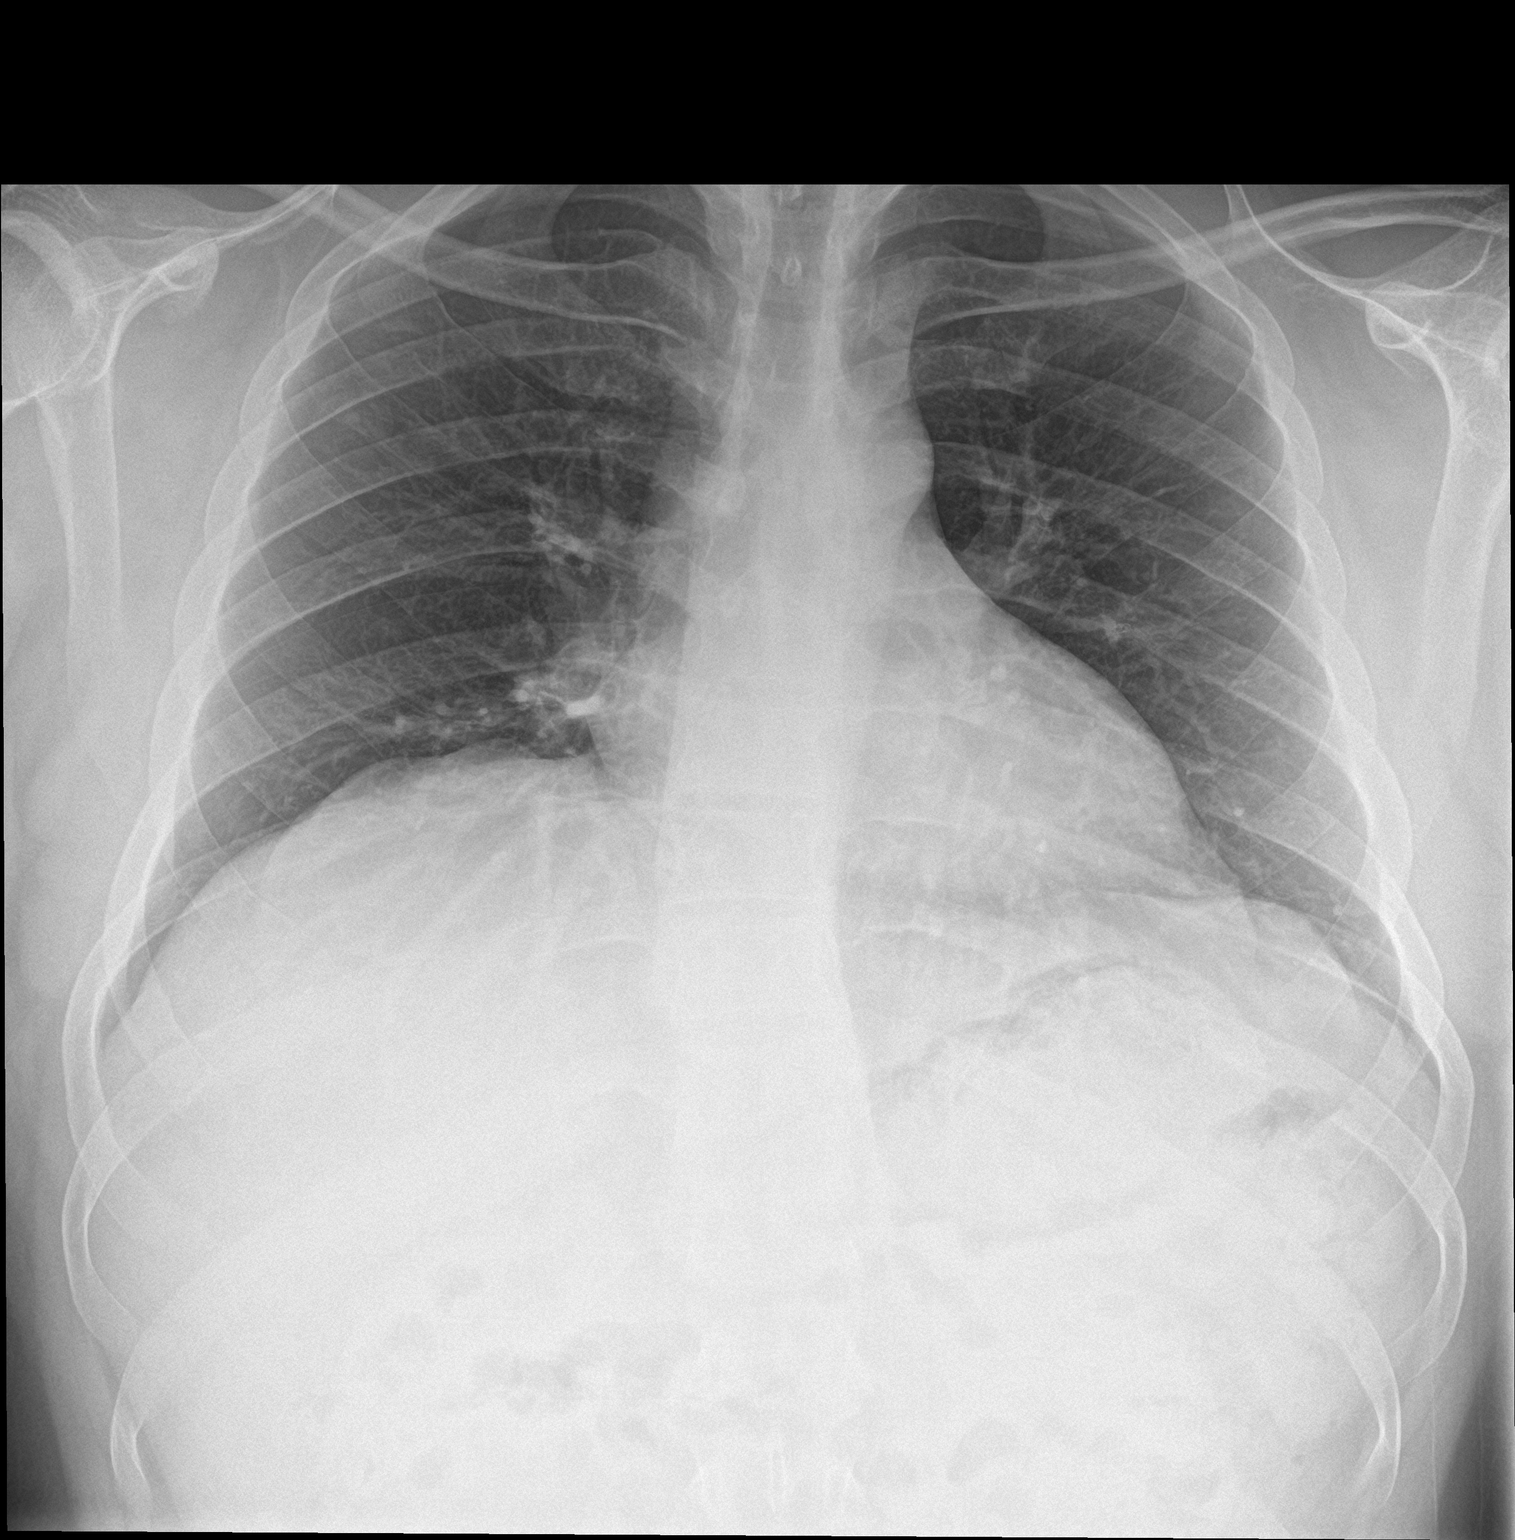

[chest lat]
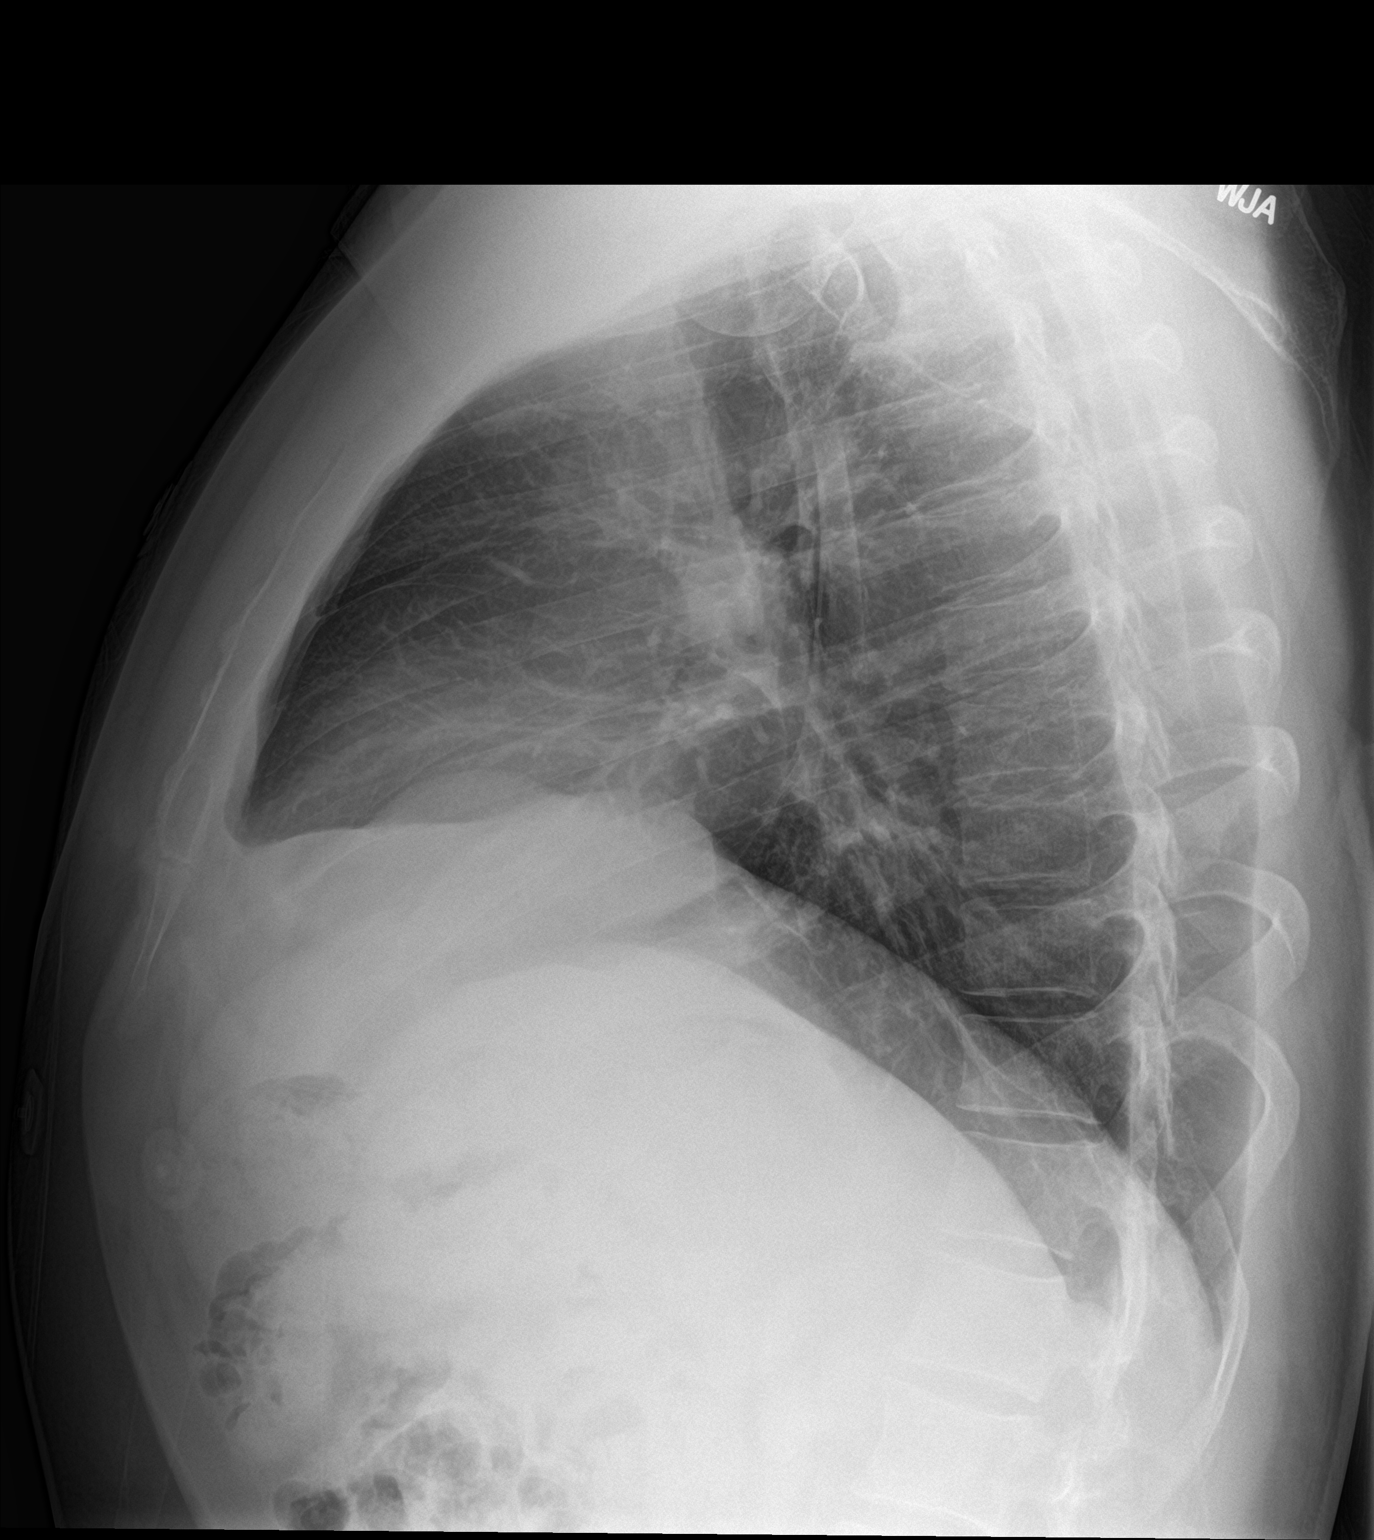

[2 of 2 positions shown; findings below may reference images not displayed]

FINDINGS: Shallow inspiration. The heart size and mediastinal contours are
within normal limits. Both lungs are clear. The visualized skeletal
structures are unremarkable.
IMPRESSION: No active cardiopulmonary disease.

## 2018-01-11 IMAGING — DX DG FOOT COMPLETE 3+V*L*
3 series · 4 of 4 positions shown · non-contrast
Comparison: None.

CLINICAL DATA: 31-year-old male with acute left foot pain following
injury. Initial encounter.

EXAM:
LEFT FOOT - COMPLETE 3+ VIEW

[foot ap]
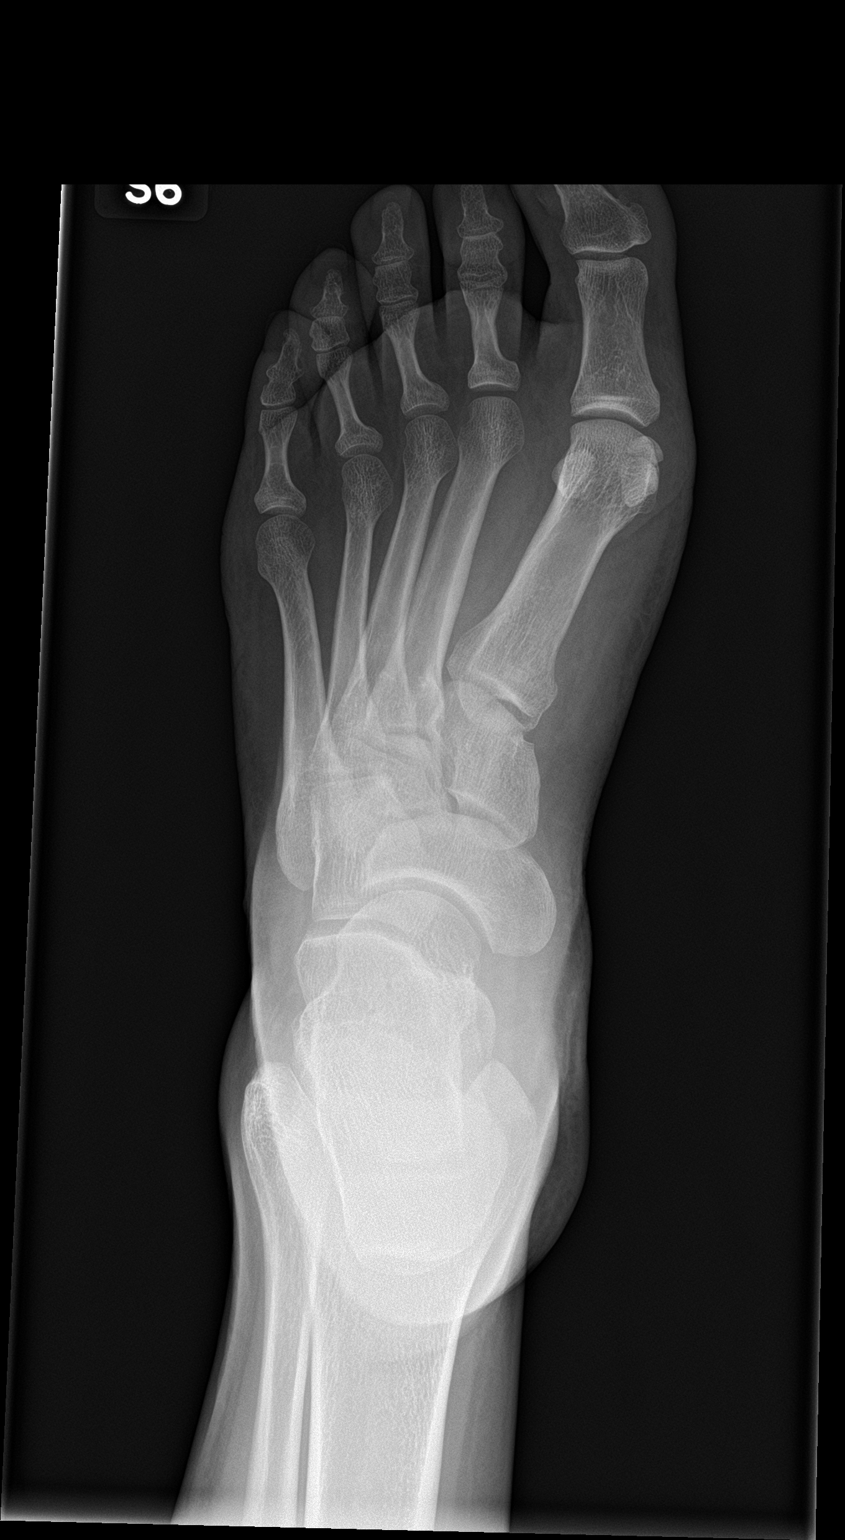

[foot obl]
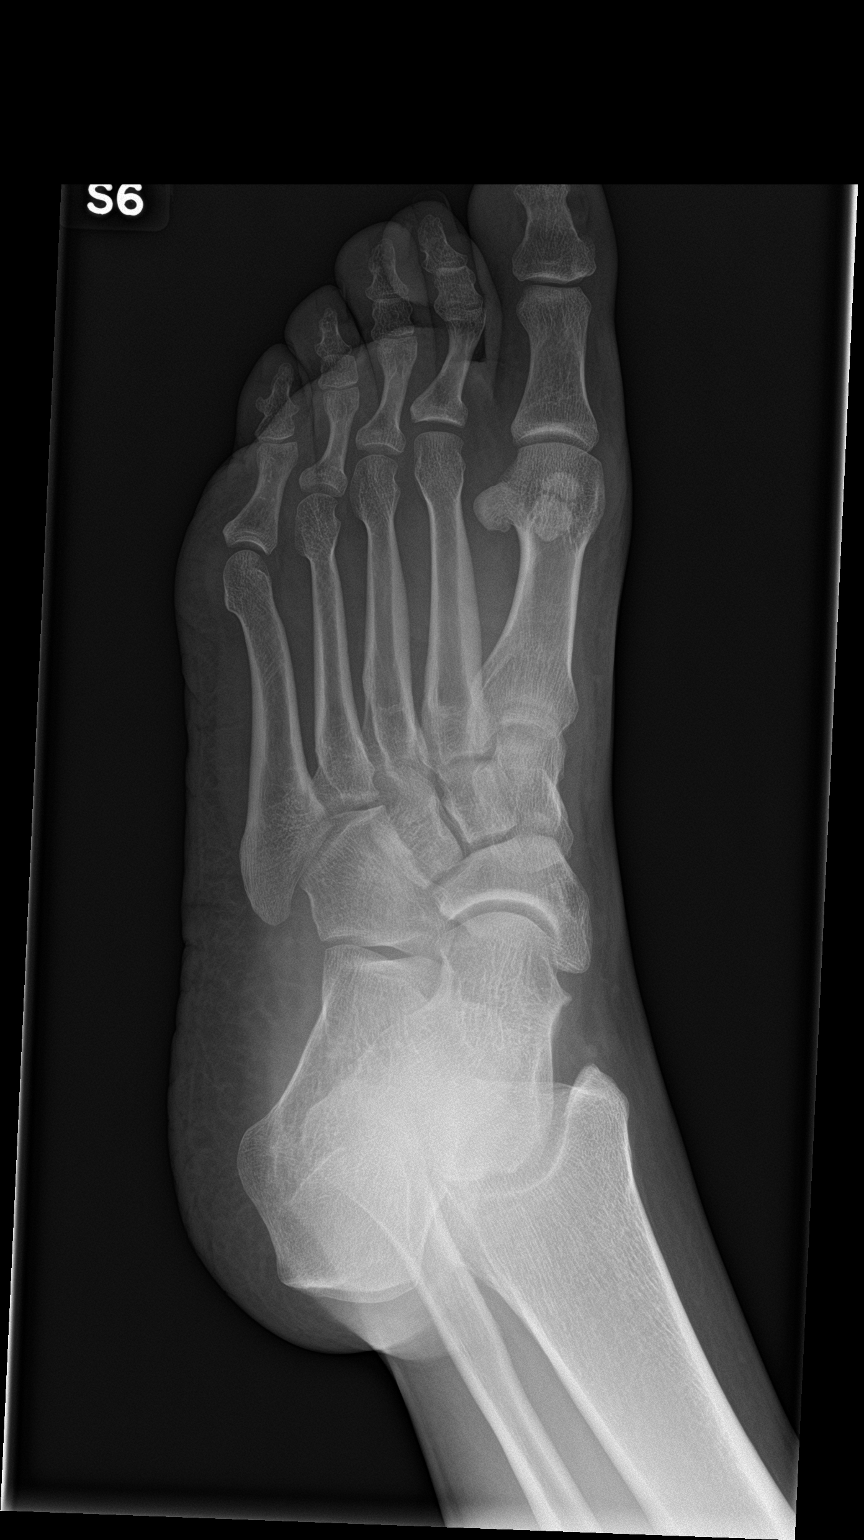

[Series 3: foot lat · 0.14mm/px · 2 of 2 slices shown]
[im 1/2]
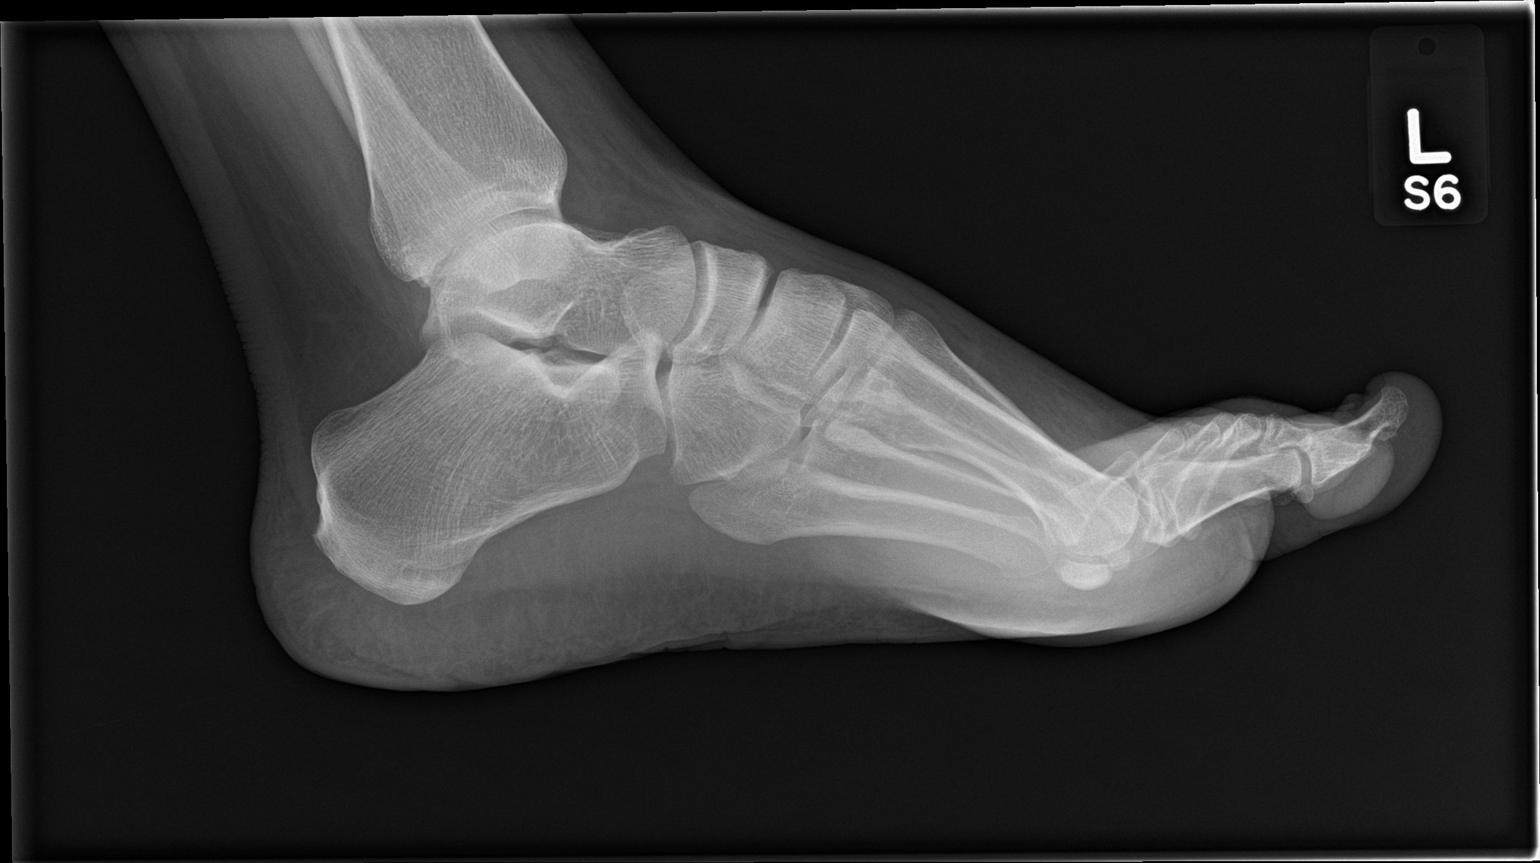
[im 2/2]
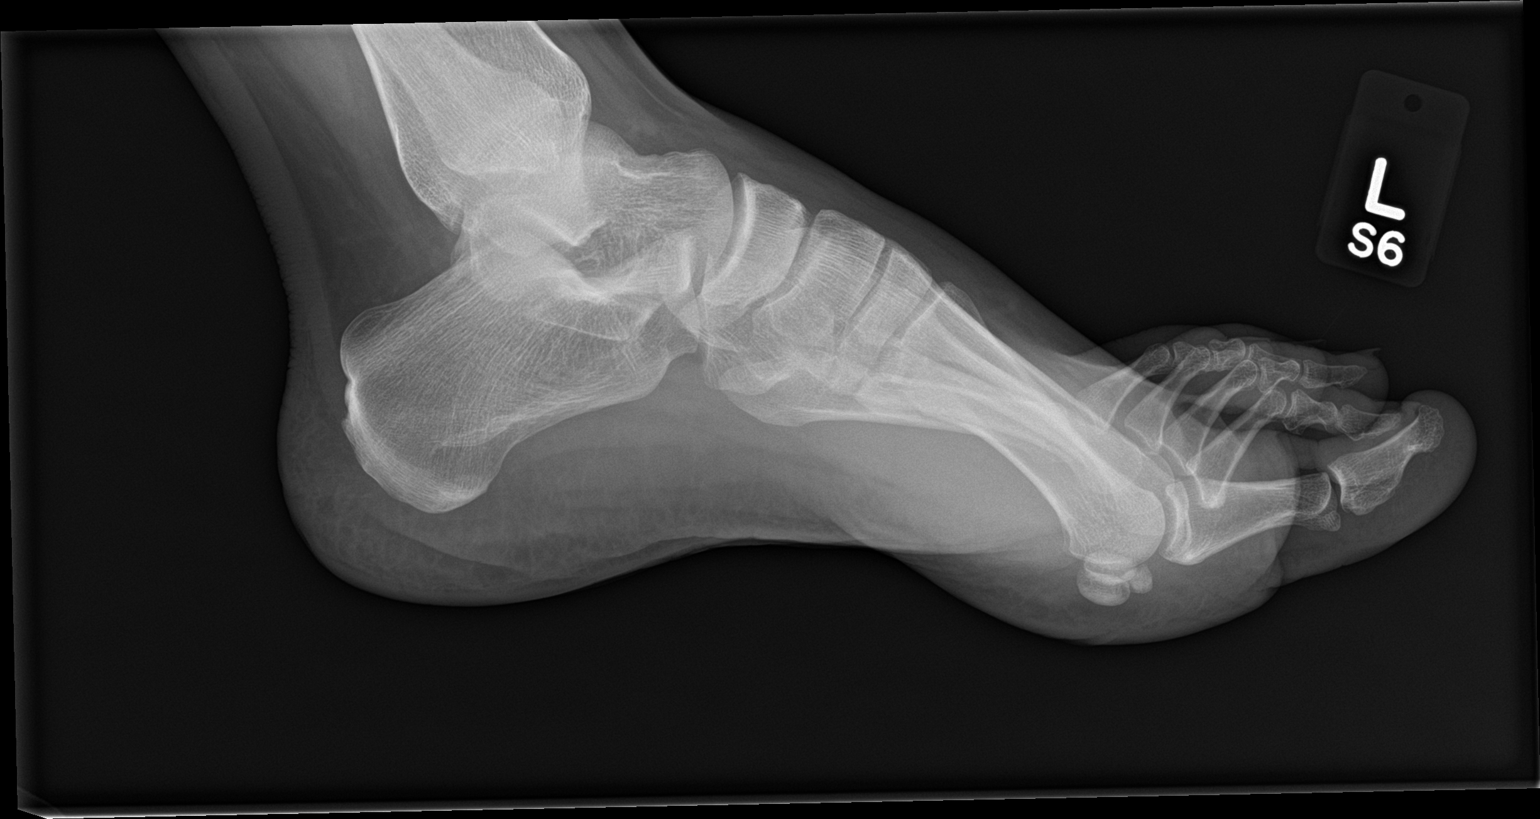

[4 of 4 positions shown; findings below may reference images not displayed]

FINDINGS: There is no evidence of fracture or dislocation. There is no
evidence of arthropathy or other focal bone abnormality. Soft
tissues are unremarkable.
IMPRESSION: Negative.

## 2018-01-14 ENCOUNTER — Ambulatory Visit (INDEPENDENT_AMBULATORY_CARE_PROVIDER_SITE_OTHER): Payer: Self-pay | Admitting: Podiatry

## 2018-01-14 ENCOUNTER — Encounter: Payer: Self-pay | Admitting: Podiatry

## 2018-01-14 DIAGNOSIS — B351 Tinea unguium: Secondary | ICD-10-CM

## 2018-01-14 MED ORDER — TERBINAFINE HCL 250 MG PO TABS: 250.0000 mg | ORAL_TABLET | Freq: Every day | ORAL | 0 refills | Status: DC

## 2018-01-18 NOTE — Progress Notes (Signed)
   Subjective: 32 year old male presenting as a new patient with a chief complaint of possible nail fungus noted to the toenails of bilateral feet that have been present for the past 7 months. He has not done anything for treatment. There are no modifying factors noted. Patient presents today for further treatment and evaluation.  Past Medical History:  Diagnosis Date  . Asthma     Objective: Physical Exam General: The patient is alert and oriented x3 in no acute distress.  Dermatology: Hyperkeratotic, discolored, thickened, onychodystrophy of nails noted bilaterally.  Skin is warm, dry and supple bilateral lower extremities. Negative for open lesions or macerations.  Vascular: Palpable pedal pulses bilaterally. No edema or erythema noted. Capillary refill within normal limits.  Neurological: Epicritic and protective threshold grossly intact bilaterally.   Musculoskeletal Exam: Range of motion within normal limits to all pedal and ankle joints bilateral. Muscle strength 5/5 in all groups bilateral.   Assessment: #1 onychomycosis nails 1-5 bilateral  Plan of Care:  #1 Patient was evaluated. #2 Prescription for Lamisil 250 mg #90 provided to patient.  #3 Appointment with Shanda BumpsJessica for laser treatment.  #4 Recommended good foot hygiene.  #5 Return to clinic as needed.    Felecia ShellingBrent M. Evans, DPM [facility]  Dr. Felecia ShellingBrent M. Evans, DPM    [address]                                        [place], [place] [address]                Office [phone]  Fax [phone]

## 2018-02-23 ENCOUNTER — Ambulatory Visit: Payer: Self-pay

## 2018-03-02 ENCOUNTER — Ambulatory Visit: Payer: Self-pay

## 2018-03-09 ENCOUNTER — Ambulatory Visit: Payer: Self-pay

## 2018-03-16 ENCOUNTER — Ambulatory Visit: Payer: Self-pay

## 2018-04-07 ENCOUNTER — Ambulatory Visit (INDEPENDENT_AMBULATORY_CARE_PROVIDER_SITE_OTHER): Payer: Self-pay

## 2018-04-07 DIAGNOSIS — B351 Tinea unguium: Secondary | ICD-10-CM

## 2018-04-22 NOTE — Progress Notes (Signed)
Pt presents with mycotic infection of nails 1-5 bilateral.  All other systems are negative  Laser therapy administered to affected nails and tolerated well. All safety precautions were in place.  2nd treatment.  Follow up in 4 weeks     

## 2018-05-05 ENCOUNTER — Ambulatory Visit (INDEPENDENT_AMBULATORY_CARE_PROVIDER_SITE_OTHER): Payer: Self-pay

## 2018-05-05 DIAGNOSIS — B351 Tinea unguium: Secondary | ICD-10-CM

## 2018-05-12 NOTE — Progress Notes (Signed)
Pt presents with mycotic infection of nails 1-5 bilateral.  All other systems are negative  Laser therapy administered to affected nails and tolerated well. All safety precautions were in place.  3rd treatment.  Follow up in 4 weeks     

## 2018-06-08 ENCOUNTER — Other Ambulatory Visit: Payer: Self-pay

## 2018-07-30 ENCOUNTER — Other Ambulatory Visit: Payer: Self-pay

## 2018-08-06 ENCOUNTER — Other Ambulatory Visit: Payer: Self-pay

## 2018-08-12 ENCOUNTER — Ambulatory Visit: Payer: Self-pay

## 2018-08-12 DIAGNOSIS — B351 Tinea unguium: Secondary | ICD-10-CM

## 2018-08-17 NOTE — Progress Notes (Signed)
Pt presents with mycotic infection of nails 1-5 bilateral  All other systems are negative  Laser therapy administered to affected nails and tolerated well. All safety precautions were in place.  3rd treatment.  Follow up in 4 weeks to recheck nails to see if additional treatment is needed

## 2018-09-09 ENCOUNTER — Ambulatory Visit: Payer: Self-pay

## 2018-09-09 DIAGNOSIS — B351 Tinea unguium: Secondary | ICD-10-CM

## 2018-09-10 NOTE — Progress Notes (Signed)
Pt presents with mycotic infection of nails 1-5 bilateral  All other systems are negative  Laser therapy administered to affected nails and tolerated well. All safety precautions were in place.  4th treatment.  Follow up in 4 weeks to recheck nails to see if additional treatment is needed. No charge for this visit, treated with new laser

## 2019-12-01 ENCOUNTER — Ambulatory Visit (INDEPENDENT_AMBULATORY_CARE_PROVIDER_SITE_OTHER): Payer: Self-pay | Admitting: Podiatry

## 2019-12-01 ENCOUNTER — Other Ambulatory Visit: Payer: Self-pay

## 2019-12-01 DIAGNOSIS — B351 Tinea unguium: Secondary | ICD-10-CM

## 2019-12-01 MED ORDER — TERBINAFINE HCL 250 MG PO TABS: 250.0000 mg | ORAL_TABLET | Freq: Every day | ORAL | 0 refills | Status: AC

## 2019-12-05 NOTE — Progress Notes (Signed)
   Subjective: 34 y.o. male presenting today with a chief complaint of continued thickening and discoloration secondary to fungus of the left great toenail that began a couple years ago. He has had laser treatment in the past for the complaint. There are no worsening factors noted. Patient is here for further evaluation and treatment.   Past Medical History:  Diagnosis Date  . Asthma     Objective: Physical Exam General: The patient is alert and oriented x3 in no acute distress.  Dermatology: Hyperkeratotic, discolored, thickened, onychodystrophy noted to the left great toenail. Skin is warm, dry and supple bilateral lower extremities. Negative for open lesions or macerations.  Vascular: Palpable pedal pulses bilaterally. No edema or erythema noted. Capillary refill within normal limits.  Neurological: Epicritic and protective threshold grossly intact bilaterally.   Musculoskeletal Exam: Range of motion within normal limits to all pedal and ankle joints bilateral. Muscle strength 5/5 in all groups bilateral.   Assessment: #1 Onychomycosis left great toenail #2 Hyperkeratotic nails left great toenail   Plan of Care:  #1 Patient was evaluated. #2 Prescription for Lamisil 250 mg #90 provided to patient.  #3 Inquired about liver function and patient declines any known history of issue or problems.  #4 Appointment with RN for laser treatment.  #5 Return to clinic as needed.   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    Obe Ahlers M. Henok Heacock, DPM [facility]  Dr. Felecia Shelling, DPM    [address]                                        [place], [place] [address]                Office [phone]  Fax [phone]

## 2019-12-24 ENCOUNTER — Other Ambulatory Visit: Payer: Self-pay
